# Patient Record
Sex: Male | Born: 1970
Health system: Southern US, Community
[De-identification: ages and names within clinical notes are randomized; demographics above are authoritative.]

## PROBLEM LIST (undated history)

## (undated) DIAGNOSIS — F1910 Other psychoactive substance abuse, uncomplicated: Secondary | ICD-10-CM

## (undated) DIAGNOSIS — F1010 Alcohol abuse, uncomplicated: Secondary | ICD-10-CM

## (undated) DIAGNOSIS — K7460 Unspecified cirrhosis of liver: Secondary | ICD-10-CM

## (undated) DIAGNOSIS — K219 Gastro-esophageal reflux disease without esophagitis: Secondary | ICD-10-CM

## (undated) HISTORY — DX: Other psychoactive substance abuse, uncomplicated: F19.10

## (undated) HISTORY — PX: ROTATOR CUFF REPAIR: SHX139

## (undated) HISTORY — DX: Gastro-esophageal reflux disease without esophagitis: K21.9

---

## 2008-07-03 ENCOUNTER — Ambulatory Visit: Payer: Self-pay | Admitting: Family Medicine

## 2008-07-03 DIAGNOSIS — K029 Dental caries, unspecified: Secondary | ICD-10-CM | POA: Insufficient documentation

## 2008-07-06 ENCOUNTER — Telehealth: Payer: Self-pay | Admitting: Family Medicine

## 2009-11-11 ENCOUNTER — Emergency Department (HOSPITAL_COMMUNITY): Admission: EM | Admit: 2009-11-11 | Discharge: 2009-11-12 | Payer: Self-pay | Admitting: Emergency Medicine

## 2010-06-20 LAB — CBC
MCH: 34.7 pg — ABNORMAL HIGH (ref 26.0–34.0)
MCHC: 34.8 g/dL (ref 30.0–36.0)
RBC: 5.06 MIL/uL (ref 4.22–5.81)
WBC: 12.9 10*3/uL — ABNORMAL HIGH (ref 4.0–10.5)

## 2010-06-20 LAB — DIFFERENTIAL
Basophils Absolute: 0.1 10*3/uL (ref 0.0–0.1)
Lymphocytes Relative: 13 % (ref 12–46)
Monocytes Absolute: 0.9 10*3/uL (ref 0.1–1.0)
Neutrophils Relative %: 79 % — ABNORMAL HIGH (ref 43–77)

## 2010-06-20 LAB — BASIC METABOLIC PANEL
CO2: 22 mEq/L (ref 19–32)
Chloride: 103 mEq/L (ref 96–112)
GFR calc Af Amer: >60 mL/min (ref >60)
GFR calc non Af Amer: >60 mL/min (ref >60)
Potassium: 4.1 mEq/L (ref 3.5–5.1)
Sodium: 139 mEq/L (ref 135–145)

## 2010-06-20 LAB — RAPID URINE DRUG SCREEN, HOSP PERFORMED: Amphetamines: NOT DETECTED

## 2010-06-20 LAB — ETHANOL: Alcohol, Ethyl (B): 5 mg/dL (ref 0–10)

## 2010-06-20 LAB — TRICYCLICS SCREEN, URINE: TCA Scrn: NOT DETECTED

## 2014-12-08 ENCOUNTER — Encounter (HOSPITAL_COMMUNITY): Payer: Self-pay | Admitting: Nurse Practitioner

## 2014-12-08 ENCOUNTER — Inpatient Hospital Stay (HOSPITAL_COMMUNITY)
Admission: EM | Admit: 2014-12-08 | Discharge: 2014-12-12 | DRG: 392 | Disposition: A | Discharge status: Home / Self Care | Payer: Medicaid Other | Attending: Internal Medicine | Admitting: Internal Medicine

## 2014-12-08 DIAGNOSIS — G4730 Sleep apnea, unspecified: Secondary | ICD-10-CM | POA: Diagnosis present

## 2014-12-08 DIAGNOSIS — F17210 Nicotine dependence, cigarettes, uncomplicated: Secondary | ICD-10-CM | POA: Diagnosis present

## 2014-12-08 DIAGNOSIS — K2920 Alcoholic gastritis without bleeding: Secondary | ICD-10-CM | POA: Diagnosis not present

## 2014-12-08 DIAGNOSIS — D696 Thrombocytopenia, unspecified: Secondary | ICD-10-CM

## 2014-12-08 DIAGNOSIS — F10230 Alcohol dependence with withdrawal, uncomplicated: Secondary | ICD-10-CM | POA: Diagnosis not present

## 2014-12-08 DIAGNOSIS — K7460 Unspecified cirrhosis of liver: Secondary | ICD-10-CM | POA: Diagnosis present

## 2014-12-08 DIAGNOSIS — R740 Nonspecific elevation of levels of transaminase and lactic acid dehydrogenase [LDH]: Secondary | ICD-10-CM | POA: Diagnosis present

## 2014-12-08 DIAGNOSIS — Y904 Blood alcohol level of 80-99 mg/100 ml: Secondary | ICD-10-CM | POA: Diagnosis present

## 2014-12-08 DIAGNOSIS — F10239 Alcohol dependence with withdrawal, unspecified: Secondary | ICD-10-CM | POA: Diagnosis present

## 2014-12-08 DIAGNOSIS — R7401 Elevation of levels of liver transaminase levels: Secondary | ICD-10-CM

## 2014-12-08 DIAGNOSIS — I459 Conduction disorder, unspecified: Secondary | ICD-10-CM | POA: Diagnosis present

## 2014-12-08 DIAGNOSIS — D6959 Other secondary thrombocytopenia: Secondary | ICD-10-CM | POA: Diagnosis present

## 2014-12-08 DIAGNOSIS — Z79899 Other long term (current) drug therapy: Secondary | ICD-10-CM

## 2014-12-08 DIAGNOSIS — I455 Other specified heart block: Secondary | ICD-10-CM | POA: Diagnosis not present

## 2014-12-08 DIAGNOSIS — G8929 Other chronic pain: Secondary | ICD-10-CM | POA: Diagnosis present

## 2014-12-08 DIAGNOSIS — I441 Atrioventricular block, second degree: Secondary | ICD-10-CM | POA: Diagnosis not present

## 2014-12-08 DIAGNOSIS — F10232 Alcohol dependence with withdrawal with perceptual disturbance: Secondary | ICD-10-CM | POA: Diagnosis not present

## 2014-12-08 DIAGNOSIS — E876 Hypokalemia: Secondary | ICD-10-CM | POA: Diagnosis present

## 2014-12-08 DIAGNOSIS — R1084 Generalized abdominal pain: Secondary | ICD-10-CM | POA: Diagnosis present

## 2014-12-08 DIAGNOSIS — K769 Liver disease, unspecified: Secondary | ICD-10-CM | POA: Diagnosis present

## 2014-12-08 DIAGNOSIS — F1410 Cocaine abuse, uncomplicated: Secondary | ICD-10-CM | POA: Diagnosis present

## 2014-12-08 DIAGNOSIS — F10939 Alcohol use, unspecified with withdrawal, unspecified: Secondary | ICD-10-CM

## 2014-12-08 HISTORY — DX: Unspecified cirrhosis of liver: K74.60

## 2014-12-08 HISTORY — DX: Alcohol abuse, uncomplicated: F10.10

## 2014-12-08 LAB — CBC
HEMATOCRIT: 47.3 % (ref 39.0–52.0)
HEMOGLOBIN: 16.2 g/dL (ref 13.0–17.0)
MCH: 33.8 pg (ref 26.0–34.0)
MCHC: 34.2 g/dL (ref 30.0–36.0)
MCV: 98.5 fL (ref 78.0–100.0)
Platelets: 107 10*3/uL — ABNORMAL LOW (ref 150–400)
RBC: 4.8 MIL/uL (ref 4.22–5.81)
RDW: 15.2 % (ref 11.5–15.5)
WBC: 8.1 10*3/uL (ref 4.0–10.5)

## 2014-12-08 LAB — COMPREHENSIVE METABOLIC PANEL
ALBUMIN: 3.4 g/dL — AB (ref 3.5–5.0)
ALT: 200 U/L — ABNORMAL HIGH (ref 17–63)
ANION GAP: 9 (ref 5–15)
AST: 408 U/L — AB (ref 15–41)
Alkaline Phosphatase: 239 U/L — ABNORMAL HIGH (ref 38–126)
BILIRUBIN TOTAL: 1.5 mg/dL — AB (ref 0.3–1.2)
CHLORIDE: 100 mmol/L — AB (ref 101–111)
CO2: 25 mmol/L (ref 22–32)
Calcium: 8.5 mg/dL — ABNORMAL LOW (ref 8.9–10.3)
Creatinine, Ser: 0.72 mg/dL (ref 0.61–1.24)
GFR calc Af Amer: >60 mL/min (ref >60)
GLUCOSE: 141 mg/dL — AB (ref 65–99)
POTASSIUM: 3.1 mmol/L — AB (ref 3.5–5.1)
Sodium: 134 mmol/L — ABNORMAL LOW (ref 135–145)
TOTAL PROTEIN: 7.3 g/dL (ref 6.5–8.1)

## 2014-12-08 LAB — RAPID URINE DRUG SCREEN, HOSP PERFORMED
AMPHETAMINES: NOT DETECTED
BARBITURATES: NOT DETECTED
Benzodiazepines: NOT DETECTED
COCAINE: POSITIVE — AB
OPIATES: NOT DETECTED
TETRAHYDROCANNABINOL: NOT DETECTED

## 2014-12-08 LAB — ETHANOL: ALCOHOL ETHYL (B): 89 mg/dL — AB (ref <5)

## 2014-12-08 LAB — POC OCCULT BLOOD, ED: Fecal Occult Bld: NEGATIVE

## 2014-12-08 MED ORDER — SODIUM CHLORIDE 0.9 % IJ SOLN
3.0000 mL | Freq: Two times a day (BID) | INTRAMUSCULAR | Status: DC
  Administered 2014-12-10 – 2014-12-11 (×4): 3 mL via INTRAVENOUS

## 2014-12-08 MED ORDER — FOLIC ACID 1 MG PO TABS
1.0000 mg | ORAL_TABLET | Freq: Every day | ORAL | Status: DC
  Administered 2014-12-09 – 2014-12-12 (×4): 1 mg via ORAL
  Filled 2014-12-08 (×4): qty 1

## 2014-12-08 MED ORDER — SODIUM CHLORIDE 0.9 % IV SOLN: 250.0000 mL | INTRAVENOUS | Status: DC | PRN

## 2014-12-08 MED ORDER — ONDANSETRON HCL 4 MG PO TABS
4.0000 mg | ORAL_TABLET | Freq: Four times a day (QID) | ORAL | Status: DC | PRN
  Administered 2014-12-10 – 2014-12-11 (×2): 4 mg via ORAL
  Filled 2014-12-08 (×4): qty 1

## 2014-12-08 MED ORDER — ONDANSETRON HCL 4 MG/2ML IJ SOLN
4.0000 mg | Freq: Once | INTRAMUSCULAR | Status: AC
  Administered 2014-12-08: 4 mg via INTRAVENOUS
  Filled 2014-12-08: qty 2

## 2014-12-08 MED ORDER — ADULT MULTIVITAMIN W/MINERALS CH
1.0000 | ORAL_TABLET | Freq: Every day | ORAL | Status: DC
  Administered 2014-12-09 – 2014-12-12 (×4): 1 via ORAL
  Filled 2014-12-08 (×4): qty 1

## 2014-12-08 MED ORDER — ACETAMINOPHEN 650 MG RE SUPP: 650.0000 mg | Freq: Four times a day (QID) | RECTAL | Status: DC | PRN

## 2014-12-08 MED ORDER — LORAZEPAM 1 MG PO TABS: 0.0000 mg | ORAL_TABLET | Freq: Two times a day (BID) | ORAL | Status: DC

## 2014-12-08 MED ORDER — LORAZEPAM 2 MG/ML IJ SOLN
0.0000 mg | Freq: Two times a day (BID) | INTRAMUSCULAR | Status: DC
  Filled 2014-12-08: qty 1

## 2014-12-08 MED ORDER — ONDANSETRON HCL 4 MG/2ML IJ SOLN
4.0000 mg | Freq: Four times a day (QID) | INTRAMUSCULAR | Status: DC | PRN
  Administered 2014-12-09 – 2014-12-12 (×9): 4 mg via INTRAVENOUS
  Filled 2014-12-08 (×9): qty 2

## 2014-12-08 MED ORDER — SODIUM CHLORIDE 0.9 % IV SOLN
INTRAVENOUS | Status: AC
  Administered 2014-12-08: 23:00:00 via INTRAVENOUS

## 2014-12-08 MED ORDER — LORAZEPAM 1 MG PO TABS: 1.0000 mg | ORAL_TABLET | Freq: Four times a day (QID) | ORAL | Status: DC | PRN

## 2014-12-08 MED ORDER — LORAZEPAM 1 MG PO TABS: 0.0000 mg | ORAL_TABLET | Freq: Four times a day (QID) | ORAL | Status: DC

## 2014-12-08 MED ORDER — SODIUM CHLORIDE 0.9 % IJ SOLN
3.0000 mL | INTRAMUSCULAR | Status: DC | PRN
  Administered 2014-12-10: 3 mL via INTRAVENOUS
  Filled 2014-12-08: qty 3

## 2014-12-08 MED ORDER — ENOXAPARIN SODIUM 40 MG/0.4ML ~~LOC~~ SOLN
40.0000 mg | SUBCUTANEOUS | Status: DC
  Administered 2014-12-09 – 2014-12-12 (×4): 40 mg via SUBCUTANEOUS
  Filled 2014-12-08 (×4): qty 0.4

## 2014-12-08 MED ORDER — THIAMINE HCL 100 MG/ML IJ SOLN
100.0000 mg | Freq: Every day | INTRAMUSCULAR | Status: DC
  Administered 2014-12-08: 100 mg via INTRAVENOUS
  Filled 2014-12-08: qty 2

## 2014-12-08 MED ORDER — VITAMIN B-1 100 MG PO TABS
100.0000 mg | ORAL_TABLET | Freq: Every day | ORAL | Status: DC
  Administered 2014-12-10 – 2014-12-12 (×3): 100 mg via ORAL
  Filled 2014-12-08 (×4): qty 1

## 2014-12-08 MED ORDER — POTASSIUM CHLORIDE CRYS ER 20 MEQ PO TBCR
40.0000 meq | EXTENDED_RELEASE_TABLET | Freq: Two times a day (BID) | ORAL | Status: DC
  Administered 2014-12-09 – 2014-12-10 (×5): 40 meq via ORAL
  Filled 2014-12-08 (×11): qty 2

## 2014-12-08 MED ORDER — LORAZEPAM 2 MG/ML IJ SOLN
1.0000 mg | Freq: Four times a day (QID) | INTRAMUSCULAR | Status: DC | PRN
  Administered 2014-12-09 (×2): 1 mg via INTRAVENOUS
  Filled 2014-12-08 (×3): qty 1

## 2014-12-08 MED ORDER — PANTOPRAZOLE SODIUM 40 MG PO TBEC
40.0000 mg | DELAYED_RELEASE_TABLET | Freq: Every day | ORAL | Status: DC
  Administered 2014-12-09 – 2014-12-12 (×4): 40 mg via ORAL
  Filled 2014-12-08 (×3): qty 1

## 2014-12-08 MED ORDER — LORAZEPAM 2 MG/ML IJ SOLN: 1.0000 mg | Freq: Once | INTRAMUSCULAR | Status: DC

## 2014-12-08 MED ORDER — SODIUM CHLORIDE 0.9 % IV BOLUS (SEPSIS)
1000.0000 mL | Freq: Once | INTRAVENOUS | Status: AC
  Administered 2014-12-08: 1000 mL via INTRAVENOUS

## 2014-12-08 MED ORDER — LORAZEPAM 1 MG PO TABS
0.0000 mg | ORAL_TABLET | Freq: Four times a day (QID) | ORAL | Status: DC
  Administered 2014-12-09: 4 mg via ORAL
  Filled 2014-12-08: qty 3
  Filled 2014-12-08 (×2): qty 4

## 2014-12-08 MED ORDER — LORAZEPAM 2 MG/ML IJ SOLN
0.0000 mg | Freq: Four times a day (QID) | INTRAMUSCULAR | Status: DC
  Administered 2014-12-08 (×2): 2 mg via INTRAVENOUS

## 2014-12-08 MED ORDER — HYDROCODONE-ACETAMINOPHEN 5-325 MG PO TABS
1.0000 | ORAL_TABLET | ORAL | Status: DC | PRN
  Administered 2014-12-09 – 2014-12-12 (×8): 2 via ORAL
  Filled 2014-12-08 (×10): qty 2

## 2014-12-08 MED ORDER — LORAZEPAM 2 MG/ML IJ SOLN
2.0000 mg | Freq: Once | INTRAMUSCULAR | Status: AC
  Administered 2014-12-08: 2 mg via INTRAVENOUS

## 2014-12-08 MED ORDER — THIAMINE HCL 100 MG/ML IJ SOLN
100.0000 mg | Freq: Every day | INTRAMUSCULAR | Status: DC
  Administered 2014-12-09: 100 mg via INTRAVENOUS
  Filled 2014-12-08: qty 2
  Filled 2014-12-08 (×2): qty 1

## 2014-12-08 MED ORDER — VITAMIN B-1 100 MG PO TABS: 100.0000 mg | ORAL_TABLET | Freq: Every day | ORAL | Status: DC

## 2014-12-08 MED ORDER — LORAZEPAM 2 MG/ML IJ SOLN
1.0000 mg | Freq: Once | INTRAMUSCULAR | Status: AC
  Filled 2014-12-08: qty 1

## 2014-12-08 MED ORDER — ZOLPIDEM TARTRATE 5 MG PO TABS
5.0000 mg | ORAL_TABLET | Freq: Every evening | ORAL | Status: DC | PRN
  Administered 2014-12-10 – 2014-12-11 (×2): 5 mg via ORAL
  Filled 2014-12-08 (×2): qty 1

## 2014-12-08 MED ORDER — SODIUM CHLORIDE 0.9 % IJ SOLN: 3.0000 mL | Freq: Two times a day (BID) | INTRAMUSCULAR | Status: DC

## 2014-12-08 MED ORDER — ACETAMINOPHEN 325 MG PO TABS: 650.0000 mg | ORAL_TABLET | Freq: Four times a day (QID) | ORAL | Status: DC | PRN

## 2014-12-08 NOTE — ED Provider Notes (Signed)
CSN: [id number]     Arrival date & time 12/08/14  1747 History   First MD Initiated Contact with Patient 12/08/14 1813     Chief Complaint  Patient presents with  . Rectal Bleeding  . Alcohol Problem  . Drug Problem     (Consider location/radiation/quality/duration/timing/severity/associated sxs/prior Treatment) The history is provided by the patient.     Pt with hx polysubstance abuse, cirrhosis, presents requesting detox, c/o diffuse abdominal pain, N/V/D x 1 week.  Abdominal pain is described as sharp, dull, and crampy.  Has also had blood on the toilet paper with wiping.  Drinks at least 1/5 liquor daily and takes any drugs he can get including crack, heroin, pills.  Last use was today around noon but notes he only drank enough to control the shakes.  Does admit to depression.  States he has no family and no support system.  Denies SI, HI.  Would like inpatient treatment.    Denies recent abx use.    Past Medical History  Diagnosis Date  . Cirrhosis   . Alcohol abuse    History reviewed. No pertinent past surgical history. History reviewed. No pertinent family history. Social History  Substance Use Topics  . Smoking status: Current Every Day Smoker    Types: Cigarettes  . Smokeless tobacco: None  . Alcohol Use: Yes    Review of Systems  Respiratory: Positive for cough (chronic smoker's cough, unchanged). Negative for shortness of breath.   Cardiovascular: Negative for chest pain.  All other systems reviewed and are negative.     Allergies  Penicillins  Home Medications   Prior to Admission medications   Medication Sig Start Date End Date Taking? Authorizing Provider  esomeprazole (NEXIUM) 40 MG capsule Take 40 mg by mouth daily at 12 noon.   Yes Historical Provider, MD   BP 146/89 mmHg  Pulse 107  Temp(Src) 99.3 F (37.4 C) (Oral)  Resp 22  Ht  (1.803 m)  Wt 239 lb (108.41 kg)  BMI 33.35 kg/m2  SpO2 95% Physical Exam  Constitutional: He appears  well-developed and well-nourished. No distress.  HENT:  Head: Normocephalic and atraumatic.  Neck: Neck supple.  Cardiovascular: Normal rate and regular rhythm.   Pulmonary/Chest: Effort normal and breath sounds normal. No respiratory distress. He has no wheezes. He has no rales.  Abdominal: Soft. He exhibits no distension. There is generalized tenderness. There is no rebound and no guarding.  Genitourinary: Rectal exam shows internal hemorrhoid. Rectal exam shows no external hemorrhoid and no tenderness. Guaiac negative stool.  Neurological: He is alert. He exhibits normal muscle tone.  Skin: He is not diaphoretic.  Psychiatric: His speech is normal and behavior is normal. He exhibits a depressed mood. He expresses no homicidal and no suicidal ideation.  Nursing note and vitals reviewed.   ED Course  Procedures (including critical care time) Labs Review Labs Reviewed  COMPREHENSIVE METABOLIC PANEL - Abnormal; Notable for the following:    Sodium 134 (*)    Potassium 3.1 (*)    Chloride 100 (*)    Glucose, Bld 141 (*)    BUN <5 (*)    Calcium 8.5 (*)    Albumin 3.4 (*)    AST 408 (*)    ALT 200 (*)    Alkaline Phosphatase 239 (*)    Total Bilirubin 1.5 (*)    All other components within normal limits  ETHANOL - Abnormal; Notable for the following:    Alcohol, Ethyl (B) 89 (*)  All other components within normal limits  CBC - Abnormal; Notable for the following:    Platelets 107 (*)    All other components within normal limits  URINE RAPID DRUG SCREEN, HOSP PERFORMED - Abnormal; Notable for the following:    Cocaine POSITIVE (*)    All other components within normal limits  LIPASE, BLOOD  TSH  CBC  COMPREHENSIVE METABOLIC PANEL  POC OCCULT BLOOD, ED    Imaging Review No results found. I have personally reviewed and evaluated these images and lab results as part of my medical decision-making.   EKG Interpretation None      MDM   Final diagnoses:  Alcohol  withdrawal, with unspecified complication    Pt with c/o alcohol and substance abuse, experiencing withdrawal symptoms of shaking and began hallucinating in the ED.  CIWA of 23.  Also with abdominal pain, N/V/D x 1 week.  Diffuse abdominal pain.  Abdomen is nonsurgical.  On review of labs, it appears the lipase never resulted.  Pt has elevation of LFTs, hx cirrhosis, no prior levels available.  Suspect this is chronic.  Admitted to [facility], Dr Mickle Mallory accepting.     Trixie Dredge, PA-C 12/08/14 1610  Lyndal Pulley, MD 12/09/14 [phone]

## 2014-12-08 NOTE — H&P (Signed)
[facility]                                                                                    Patient Demographics  Chad Marks, is a 44 y.o. male  CSN: [id number]  MRN: [id number]  DOB - 25-Sep-1970  Admit Date - 12/08/2014  Outpatient Primary MD for the patient is No primary care provider on file.   With History of -  Past Medical History  Diagnosis Date  . Cirrhosis   . Alcohol abuse       History reviewed. No pertinent past surgical history.  in for   Chief Complaint  Patient presents with  . Rectal Bleeding  . Alcohol Problem  . Drug Problem     HPI  Teng Decou  is a 44 y.o. male, with no significant past medical history but a history of polysubstance drug abuse, presenting today for help. The patient admits to drinking one fifth of liquor a day in addition to heroin and crack. He denies IV abuse. Patient denies any chest pains shortness of breath nausea vomiting or diarrhea, but he started shaking. He says that he cannot go on like this anymore. His last alcohol was at 1:00 this afternoon. He also reports heroin and crack at 1 PM today.    Review of Systems    In addition to the HPI above,  No Fever-chills, No Headache, No changes with Vision or hearing, No problems swallowing food or Liquids, No Chest pain, Cough or Shortness of Breath, No Abdominal pain, No Nausea or Vommitting, Bowel movements are regular, No Blood in stool or Urine, No dysuria, No new skin rashes or bruises, No new joints pains-aches,  No new weakness, tingling, numbness in any extremity, No recent weight gain or loss, No polyuria, polydypsia or polyphagia, No significant Mental Stressors.  A full 10 point Review of Systems was done, except as stated above, all other Review of Systems were negative.   Social History Social History  Substance Use Topics  . Smoking status: Current Every Day Smoker    Types: Cigarettes  . Smokeless tobacco: Not on file  .  Alcohol Use: Yes     Family History History reviewed. No pertinent family history.   Prior to Admission medications   Medication Sig Start Date End Date Taking? Authorizing Provider  esomeprazole (NEXIUM) 20 MG capsule Take 20 mg by mouth 2 (two) times daily as needed (starts with 1 cap daily, takes 1 addt'l as needed).   Yes Historical Provider, MD  omeprazole (PRILOSEC OTC) 20 MG tablet Take 20 mg by mouth daily.   Yes Historical Provider, MD    Allergies  Allergen Reactions  . Penicillins Other (See Comments)    Told not to take med    Physical Exam  Vitals  Blood pressure 128/81, pulse 103, temperature 99.3 F (37.4 C), temperature source Oral, resp. rate 24, height  (1.803 m), weight 108.41 kg (239 lb), SpO2 90 %.   1. General young male very drowsy, well-developed and nourished  2. Normal affect and insight, Not Suicidal or Homicidal, confused.  3. No F.N deficits, grossly, ALL C.Nerves Intact,  4.  Ears and Eyes appear Normal, Conjunctivae clear injected, PERRLA. Moist Oral Mucosa.  5. Supple Neck, No JVD, No cervical lymphadenopathy appriciated, No Carotid Bruits.  6. Symmetrical Chest wall movement, Good air movement bilaterally, CTAB.  7. RRR, No Gallops, Rubs or Murmurs, No Parasternal Heave.  8. Positive Bowel Sounds, Abdomen Soft, Non tender, No organomegaly appriciated,No rebound -guarding or rigidity.  9.  No Cyanosis, Normal Skin Turgor, No Skin Rash or Bruise.  10. Good muscle tone,  joints appear normal , no effusions, Normal ROM.      Data Review  CBC  Recent Labs Lab 12/08/14 1810  WBC 8.1  HGB 16.2  HCT 47.3  PLT 107*  MCV 98.5  MCH 33.8  MCHC 34.2  RDW 15.2   ------------------------------------------------------------------------------------------------------------------  Chemistries   Recent Labs Lab 12/08/14 1810  NA 134*  K 3.1*  CL 100*  CO2 25  GLUCOSE 141*  BUN <5*  CREATININE 0.72  CALCIUM 8.5*  AST  408*  ALT 200*  ALKPHOS 239*  BILITOT 1.5*   ------------------------------------------------------------------------------------------------------------------ estimated creatinine clearance is 147.5 mL/min (by C-G formula based on Cr of 0.72). ------------------------------------------------------------------------------------------------------------------ No results for input(s): TSH, T4TOTAL, T3FREE, THYROIDAB in the last 72 hours.  Invalid input(s): FREET3   Coagulation profile No results for input(s): INR, PROTIME in the last 168 hours. ------------------------------------------------------------------------------------------------------------------- No results for input(s): DDIMER in the last 72 hours. -------------------------------------------------------------------------------------------------------------------  Cardiac Enzymes No results for input(s): CKMB, TROPONINI, MYOGLOBIN in the last 168 hours.  Invalid input(s): CK ------------------------------------------------------------------------------------------------------------------ Invalid input(s): POCBNP   ---------------------------------------------------------------------------------------------------------------  Urinalysis No results found for: COLORURINE, APPEARANCEUR, LABSPEC, PHURINE, GLUCOSEU, HGBUR, BILIRUBINUR, KETONESUR, PROTEINUR, UROBILINOGEN, NITRITE, LEUKOCYTESUR  ----------------------------------------------------------------------------------------------------------------  Imaging results:   No results found.    Assessment & Plan  1. Alcohol withdrawal, SIWA 23 2. Thrombocytopenia probably due to alcohol intake and bone marrow depression 3. Transaminitis 4. Hypokalemia  Plan  Admit to telemetry Withdrawal protocol with Ativan when necessary Follow labs in a.m. Replace potassium   DVT Prophylaxis Lovenox  AM Labs Ordered, also please review Full Orders  Code Status  full  Disposition Plan: Rehabilitation  Time spent in minutes : 32 minutes  Condition GUARDED   @SIGNATURE @

## 2014-12-08 NOTE — ED Notes (Signed)
Attempted report 

## 2014-12-08 NOTE — ED Notes (Signed)
  of ativan given per PA for CIWA score

## 2014-12-08 NOTE — ED Notes (Signed)
He reports heavy alcohol and drug use over the past 2-3 weeks. This week he has noticed some blood in his stool so he is concerned because he reports in the past he had rectal bleeding due to cirrhosis. He would like to detox from drugs and alcohol and have the rectal bleeding checked also. He reports generalized body aches which is normal for him. He reports using "any drug i can get my hands on," no drug use today. He did drink alcohol today. He is diaphoretic and anxious in triage. He denies SI/HUI. He is A&Ox4, breathing easily

## 2014-12-08 NOTE — ED Notes (Signed)
IV attempted x2 without success.

## 2014-12-08 NOTE — ED Notes (Signed)
Pt placed on 2L [place] for sats 88% on room air

## 2014-12-08 NOTE — ED Notes (Signed)
Irving Burton PA notified of CIWA score of 23

## 2014-12-09 ENCOUNTER — Encounter (HOSPITAL_COMMUNITY): Payer: Self-pay | Admitting: *Deleted

## 2014-12-09 DIAGNOSIS — R740 Nonspecific elevation of levels of transaminase and lactic acid dehydrogenase [LDH]: Secondary | ICD-10-CM

## 2014-12-09 DIAGNOSIS — R7401 Elevation of levels of liver transaminase levels: Secondary | ICD-10-CM

## 2014-12-09 DIAGNOSIS — F10232 Alcohol dependence with withdrawal with perceptual disturbance: Secondary | ICD-10-CM

## 2014-12-09 DIAGNOSIS — E876 Hypokalemia: Secondary | ICD-10-CM

## 2014-12-09 DIAGNOSIS — D696 Thrombocytopenia, unspecified: Secondary | ICD-10-CM

## 2014-12-09 DIAGNOSIS — F1410 Cocaine abuse, uncomplicated: Secondary | ICD-10-CM

## 2014-12-09 LAB — COMPREHENSIVE METABOLIC PANEL
ALT: 156 U/L — ABNORMAL HIGH (ref 17–63)
AST: 318 U/L — AB (ref 15–41)
Albumin: 2.6 g/dL — ABNORMAL LOW (ref 3.5–5.0)
Alkaline Phosphatase: 198 U/L — ABNORMAL HIGH (ref 38–126)
Anion gap: 5 (ref 5–15)
CHLORIDE: 103 mmol/L (ref 101–111)
CO2: 27 mmol/L (ref 22–32)
Calcium: 7.6 mg/dL — ABNORMAL LOW (ref 8.9–10.3)
Creatinine, Ser: 0.65 mg/dL (ref 0.61–1.24)
Glucose, Bld: 101 mg/dL — ABNORMAL HIGH (ref 65–99)
POTASSIUM: 3.5 mmol/L (ref 3.5–5.1)
SODIUM: 135 mmol/L (ref 135–145)
Total Bilirubin: 1.2 mg/dL (ref 0.3–1.2)
Total Protein: 6 g/dL — ABNORMAL LOW (ref 6.5–8.1)

## 2014-12-09 LAB — CBC
HCT: 41.2 % (ref 39.0–52.0)
Hemoglobin: 13.3 g/dL (ref 13.0–17.0)
MCH: 32.4 pg (ref 26.0–34.0)
MCHC: 32.3 g/dL (ref 30.0–36.0)
MCV: 100.5 fL — AB (ref 78.0–100.0)
PLATELETS: 77 10*3/uL — AB (ref 150–400)
RBC: 4.1 MIL/uL — ABNORMAL LOW (ref 4.22–5.81)
RDW: 15.2 % (ref 11.5–15.5)
WBC: 4.9 10*3/uL (ref 4.0–10.5)

## 2014-12-09 LAB — TSH: TSH: 1.745 u[IU]/mL (ref 0.350–4.500)

## 2014-12-09 LAB — LIPASE, BLOOD: LIPASE: 17 U/L — AB (ref 22–51)

## 2014-12-09 MED ORDER — LORAZEPAM 2 MG/ML IJ SOLN: 0.0000 mg | Freq: Two times a day (BID) | INTRAMUSCULAR | Status: DC

## 2014-12-09 MED ORDER — LORAZEPAM 2 MG/ML IJ SOLN
3.0000 mg | Freq: Once | INTRAMUSCULAR | Status: AC
  Administered 2014-12-09: 3 mg via INTRAVENOUS
  Filled 2014-12-09: qty 2

## 2014-12-09 MED ORDER — INFLUENZA VAC SPLIT QUAD 0.5 ML IM SUSY
0.5000 mL | PREFILLED_SYRINGE | INTRAMUSCULAR | Status: DC
  Filled 2014-12-09: qty 0.5

## 2014-12-09 MED ORDER — LORAZEPAM 1 MG PO TABS: 1.0000 mg | ORAL_TABLET | Freq: Four times a day (QID) | ORAL | Status: DC | PRN

## 2014-12-09 MED ORDER — NICOTINE 21 MG/24HR TD PT24
21.0000 mg | MEDICATED_PATCH | Freq: Every evening | TRANSDERMAL | Status: DC
  Administered 2014-12-09 – 2014-12-11 (×3): 21 mg via TRANSDERMAL
  Filled 2014-12-09 (×3): qty 1

## 2014-12-09 MED ORDER — PNEUMOCOCCAL VAC POLYVALENT 25 MCG/0.5ML IJ INJ
0.5000 mL | INJECTION | INTRAMUSCULAR | Status: DC
  Filled 2014-12-09: qty 0.5

## 2014-12-09 MED ORDER — LORAZEPAM 2 MG/ML IJ SOLN
0.0000 mg | Freq: Four times a day (QID) | INTRAMUSCULAR | Status: DC
  Administered 2014-12-09: 4 mg via INTRAVENOUS
  Administered 2014-12-10: 2 mg via INTRAVENOUS
  Filled 2014-12-09: qty 2
  Filled 2014-12-09: qty 1

## 2014-12-09 MED ORDER — LORAZEPAM 1 MG PO TABS: 0.0000 mg | ORAL_TABLET | ORAL | Status: DC | PRN

## 2014-12-09 MED ORDER — LORAZEPAM 2 MG/ML IJ SOLN
4.0000 mg | Freq: Once | INTRAMUSCULAR | Status: AC
  Administered 2014-12-09: 4 mg via INTRAVENOUS
  Filled 2014-12-09: qty 2

## 2014-12-09 MED ORDER — LORAZEPAM 1 MG PO TABS
0.0000 mg | ORAL_TABLET | ORAL | Status: DC | PRN
Start: 2014-12-09 — End: 2014-12-09
  Administered 2014-12-09: 2 mg via ORAL
  Administered 2014-12-09 (×2): 4 mg via ORAL
  Filled 2014-12-09: qty 4
  Filled 2014-12-09: qty 2
  Filled 2014-12-09: qty 4

## 2014-12-09 MED ORDER — LORAZEPAM 2 MG/ML IJ SOLN
1.0000 mg | Freq: Four times a day (QID) | INTRAMUSCULAR | Status: DC | PRN
  Administered 2014-12-09 – 2014-12-10 (×2): 1 mg via INTRAVENOUS
  Filled 2014-12-09 (×3): qty 1

## 2014-12-09 NOTE — Progress Notes (Signed)
New Admission Note:   Arrival Method: Via stretcher from the ED - able to ambulate to bed with standby assist Mental Orientation:  A & O x 4 Telemetry: Placed on Tele Box 6E20 Assessment: Completed Skin:  Strong body odor.  He is refusing full skin assessment at this time IV:  Left AC Pain: States he has headache 10/10 and LUQ abdominal pain as 10/10 Tubes:  None Safety Measures: Safety Fall Prevention Plan has been given, discussed and signed Admission: Completed 6 [place] Orientation: Patient has been orientated to the room, unit and staff.  Family:  None  Patient is made aware that he is in camera room and voiced understanding of the reason for this (active alcohol, heroin, and cocaine withdrawal, history of seizures while withdrawing).  Clothes are at bedside.  He did have money, wallet, and keys.  These were itemized and taken to Security to be put in the hospital safe for safe keeping.  He stated that he is currently basically homeless (lives in an abandoned property with a friend) and Social Work consult initiated.  He states he is on Seroquel 400 mg that he has not taken in over a week and note left under Physician Sticky Note for MD to address.    Orders have been reviewed and implemented. Will continue to monitor the patient. Call light has been placed within reach and bed alarm has been activated.   Bernie Covey RN- [place], [place] Phone number: [phone]

## 2014-12-09 NOTE — Progress Notes (Signed)
PROGRESS NOTE  Chad Marks ZOX:[id number] DOB: Aug 24, 1970 DOA: 12/08/2014 PCP: No primary care provider on file.  Brief history 44 year old male with a history of polysubstance abuse including tobacco, alcohol, and cocaine presented to the emergency department with approximately one-week history of vomiting and diarrhea and requesting detox. Patient stated that he drinks at least one fifth of liquor daily and has been using crack cocaine with his last use around noon time 12/08/2014. The patient denies any SI, HI.  Initial labs revealed AST 408, ALT 200, total bilirubin 1.5, lipase 17. Urine drug screen positive for cocaine. In the emergency department, the patient was noted to have withdrawal-like symptoms including hallucinations and jitteriness. Assessment/Plan: Alcohol withdrawal -Continue alcohol withdrawal protocol, increased frequency of benzodiazepine to every hour when necessary agitation -Continue thiamine Hypokalemia -Replete -Check magnesium Nausea/vomiting/abdominal pain/Transaminasemia -Normal lipase -Right upper quadrant ultrasound -Likely due to alcoholic gastritis -Continue PPI -Appears to be improving since hospitalization -continue IVF Thrombocytopenia  -secondary to myelosuppression from alcohol use - patient apparently has underlying cirrhosis  -B12 -HIV -Hep B surface antigen -Hep C antibody Polysubstance abuse  -Including cocaine and tobacco  -NicoDerm patch  -Cessation discussed     Family Communication:   Pt at beside Disposition Plan:   Home 2-3 days      Procedures/Studies:  No results found.      Subjective:  patient is still having significant nausea without emesis. Denies any fevers, chills, chest pain, shortness of breath or diarrhea, hematochezia, melena. There is no hematemesis. He has the progression of abdominal pain.  Objective: Filed Vitals:   12/08/14 2245 12/08/14 2326 12/09/14 0041 12/09/14 0420  BP: 120/78  152/96 117/79 93/57  Pulse: 101 113 94 85  Temp:  98 F (36.7 C) 98.3 F (36.8 C) 97.9 F (36.6 C)  TempSrc:  Oral Oral Axillary  Resp: Height:   (1.803 m)    Weight:  111.131 kg (245 lb)    SpO2: 95% 96% 99% 100%    Intake/Output Summary (Last 24 hours) at 12/09/14 1708 Last data filed at 12/09/14 0818  Gross per 24 hour  Intake 2741.67 ml  Output      0 ml  Net 2741.67 ml   Weight change:  Exam:   General:  Pt is alert, follows commands appropriately, not in acute distress  HEENT: No icterus, No thrush, No neck mass, Tangent/AT  Cardiovascular: RRR, S1/S2, no rubs, no gallops  Respiratory: bibasilar rales. No wheezing.  Abdomen: Soft/+BS, epigastric pain without guarding, non distended, no guarding; no hepatosplenomegaly   Extremities: 1+LE edema, No lymphangitis, No petechiae, No rashes, no synovitis; clubbing of nails without any cyanosis   Data Reviewed: Basic Metabolic Panel:  Recent Labs Lab 12/08/14 1810 12/09/14 0615  NA 134* 135  K 3.1* 3.5  CL 100* 103  CO2 25 27  GLUCOSE 141* 101*  BUN <5* <5*  CREATININE 0.72 0.65  CALCIUM 8.5* 7.6*   Liver Function Tests:  Recent Labs Lab 12/08/14 1810 12/09/14 0615  AST 408* 318*  ALT 200* 156*  ALKPHOS 239* 198*  BILITOT 1.5* 1.2  PROT 7.3 6.0*  ALBUMIN 3.4* 2.6*    Recent Labs Lab 12/08/14 2352  LIPASE 17*   No results for input(s): AMMONIA in the last 168 hours. CBC:  Recent Labs Lab 12/08/14 1810 12/09/14 0615  WBC 8.1 4.9  HGB 16.2 13.3  HCT 47.3 41.2  MCV 98.5 100.5*  PLT 107* 77*   Cardiac Enzymes: No results for input(s): CKTOTAL, CKMB, CKMBINDEX, TROPONINI in the last 168 hours. BNP: Invalid input(s): POCBNP CBG: No results for input(s): GLUCAP in the last 168 hours.  No results found for this or any previous visit (from the past 240 hour(s)).   Scheduled Meds: . enoxaparin (LOVENOX) injection  40 mg Subcutaneous Q24H  . folic acid  1 mg Oral Daily  .  multivitamin with minerals  1 tablet Oral Daily  . pantoprazole  40 mg Oral Daily  . pneumococcal 23 valent vaccine  0.5 mL Intramuscular Tomorrow-1000  . potassium chloride SA  40 mEq Oral BID  . sodium chloride  3 mL Intravenous Q12H  . sodium chloride  3 mL Intravenous Q12H  . thiamine  100 mg Oral Daily   Or  . thiamine  100 mg Intravenous Daily   Continuous Infusions:    Binnie Vonderhaar, DO  [facility] Pager [phone]  If 7PM-7AM, please contact night-coverage [email or web address] Password [email or web address] 12/09/2014, 5:08 PM   LOS: 1 day

## 2014-12-09 NOTE — Progress Notes (Signed)
Patient came to floor at approximately 2315 last night.  He is cooperative.  His  CIWA scores throughout the night were 27, 20, 14, and 22.  [facility] called several times to make aware.  Orders received and initiated each time.  Also, Rapid Response RN was called and made aware of the situation.  At the time of her rounding, the patient was sleeping.  Will continue to monitor patient closely.  [facility] will discuss with oncoming doctor as to whether the scheduled PO ativan can be changed to IV.  [employer] RN-BC, WTA.

## 2014-12-10 ENCOUNTER — Inpatient Hospital Stay (HOSPITAL_COMMUNITY): Payer: Medicaid Other

## 2014-12-10 DIAGNOSIS — F10230 Alcohol dependence with withdrawal, uncomplicated: Secondary | ICD-10-CM

## 2014-12-10 LAB — COMPREHENSIVE METABOLIC PANEL
ALK PHOS: 213 U/L — AB (ref 38–126)
ALT: 174 U/L — ABNORMAL HIGH (ref 17–63)
ANION GAP: 7 (ref 5–15)
AST: 344 U/L — ABNORMAL HIGH (ref 15–41)
Albumin: 2.7 g/dL — ABNORMAL LOW (ref 3.5–5.0)
BUN: <5 mg/dL — ABNORMAL LOW (ref 6–20)
CALCIUM: 8 mg/dL — AB (ref 8.9–10.3)
CO2: 26 mmol/L (ref 22–32)
Chloride: 104 mmol/L (ref 101–111)
Creatinine, Ser: 0.58 mg/dL — ABNORMAL LOW (ref 0.61–1.24)
Glucose, Bld: 85 mg/dL (ref 65–99)
Potassium: 3.9 mmol/L (ref 3.5–5.1)
SODIUM: 137 mmol/L (ref 135–145)
TOTAL PROTEIN: 6.1 g/dL — AB (ref 6.5–8.1)
Total Bilirubin: 1.2 mg/dL (ref 0.3–1.2)

## 2014-12-10 LAB — MRSA PCR SCREENING: MRSA by PCR: NEGATIVE

## 2014-12-10 LAB — CBC
HCT: 40.9 % (ref 39.0–52.0)
HEMOGLOBIN: 13.3 g/dL (ref 13.0–17.0)
MCH: 32.8 pg (ref 26.0–34.0)
MCHC: 32.5 g/dL (ref 30.0–36.0)
MCV: 100.7 fL — ABNORMAL HIGH (ref 78.0–100.0)
Platelets: 64 10*3/uL — ABNORMAL LOW (ref 150–400)
RBC: 4.06 MIL/uL — AB (ref 4.22–5.81)
RDW: 14.9 % (ref 11.5–15.5)
WBC: 4.2 10*3/uL (ref 4.0–10.5)

## 2014-12-10 LAB — HIV ANTIBODY (ROUTINE TESTING W REFLEX): HIV SCREEN 4TH GENERATION: NONREACTIVE

## 2014-12-10 LAB — VITAMIN B12: VITAMIN B 12: 379 pg/mL (ref 180–914)

## 2014-12-10 MED ORDER — SENNA 8.6 MG PO TABS
2.0000 | ORAL_TABLET | Freq: Every day | ORAL | Status: DC
  Administered 2014-12-11: 17.2 mg via ORAL
  Filled 2014-12-10 (×3): qty 2

## 2014-12-10 MED ORDER — LORAZEPAM 2 MG/ML IJ SOLN: 0.0000 mg | INTRAMUSCULAR | Status: DC

## 2014-12-10 MED ORDER — LORAZEPAM 2 MG/ML IJ SOLN
4.0000 mg | Freq: Once | INTRAMUSCULAR | Status: AC
  Administered 2014-12-10: 4 mg via INTRAVENOUS

## 2014-12-10 MED ORDER — MORPHINE SULFATE (PF) 2 MG/ML IV SOLN
2.0000 mg | INTRAVENOUS | Status: DC | PRN
  Administered 2014-12-10 – 2014-12-12 (×10): 2 mg via INTRAVENOUS
  Filled 2014-12-10 (×11): qty 1

## 2014-12-10 MED ORDER — LORAZEPAM 2 MG/ML IJ SOLN
0.0000 mg | INTRAMUSCULAR | Status: DC | PRN
  Administered 2014-12-10: 2 mg via INTRAVENOUS
  Administered 2014-12-10 (×2): 4 mg via INTRAVENOUS
  Administered 2014-12-10: 2 mg via INTRAVENOUS
  Administered 2014-12-11: 1 mg via INTRAVENOUS
  Administered 2014-12-11 (×3): 2 mg via INTRAVENOUS
  Administered 2014-12-11: 1 mg via INTRAVENOUS
  Administered 2014-12-12 (×2): 2 mg via INTRAVENOUS
  Filled 2014-12-10: qty 2
  Filled 2014-12-10 (×9): qty 1
  Filled 2014-12-10 (×2): qty 2
  Filled 2014-12-10 (×2): qty 1

## 2014-12-10 MED ORDER — LORAZEPAM 2 MG/ML IJ SOLN
0.0000 mg | INTRAMUSCULAR | Status: DC
  Administered 2014-12-10 (×3): 4 mg via INTRAVENOUS
  Filled 2014-12-10 (×3): qty 2

## 2014-12-10 MED ORDER — LORAZEPAM 2 MG/ML IJ SOLN
0.0000 mg | INTRAMUSCULAR | Status: DC
  Administered 2014-12-12: 2 mg via INTRAVENOUS

## 2014-12-10 MED ORDER — DOCUSATE SODIUM 100 MG PO CAPS
100.0000 mg | ORAL_CAPSULE | Freq: Two times a day (BID) | ORAL | Status: DC
  Administered 2014-12-11 (×2): 100 mg via ORAL
  Filled 2014-12-10 (×3): qty 1

## 2014-12-10 NOTE — Significant Event (Signed)
Rapid Response Event Note  Overview: Time Called: 2315 Arrival Time: 2320 Event Type: Neurologic  Initial Focused Assessment:  Called by Cala Bradford, RN  With pt's CIWA now 34, pt wanting to sign out AMA, experiencing increased agitation, visual hallucinations, severe diaphoresis. Received ativan 4 mg at 2001,  And 1 mg at 2343 and 0039 hrs.   On arrival to room pt in bed with hospital gown wrapped around his legs and trying to remove cardiac monitor.  Oriented to self and place, confused to time.  Speech clear, states 10/10 HA, wants  to leave to go home and get some sleep. C/o nausea, denies vomiting.   Visible tremors noted to BUE, mild diaphoresis, nauseated, tactile disturbances of bugs crawling on entire body, visual disturbances of flashing strobe lights , and seeing people opening and closing his room door when no one is there.  Agitated and restless but not belligerant or combative. VS: 156/105  HR 95 SR,  RR 18 with O2 sats 99% on 2l [place] Bil[place] BS coarse, abd obese, active bowel sounds.    Bed alarm in place.  Continue to support pt and staff.  Awaiting CCM eval   Event Summary:t 2310 Tama Gander, NP paged at [phone] and 2355.  Dr Toniann Fail at bedside at 0100.  CCM consulted  at  0105. Awaiting CCM eval for placement.   69 Spoke with Dr Belinda Block, CCM who feels pt does not qualify for ICU placement at this time despite elevated CIWA score. Will transfer to SDU per Dr Toniann Fail. for closer observation.  0300  Pt trasferred to 3S05.  Report given by Cala Bradford, RN 6 E to 3S staff.  Will follow as needed

## 2014-12-10 NOTE — Progress Notes (Signed)
During the night, the patient's CIWA score continued to trend up, reaching as high as 34.  [facility] floor coverage provider consulted with, orders received, and implemented.  Rapid Response called and evaluated patient.  Dr. Toniann Fail and Dr. Belinda Block also assessed patient.  Step down transfer was recommended and patient was transferred to 3S05 successfully.  He is voicing desire to sign himself out of hospital.  Patient encouraged to stay and received treatment.  [employer] RN-BC, WTA.

## 2014-12-10 NOTE — Progress Notes (Addendum)
Pt did have an earlier pause of 3.95 which looked like artifact on screen and on strip.  Pt asymptomatic at that time. RN replaced the leads and continued to monitor. Pt had a 4.3 second pause, questionable artifact.  Again, pt is asymptomatic. Notified MD. EKG ordered.  Will continue to monitor.  Dr Tat at bedside to review strips and EKG.

## 2014-12-10 NOTE — Progress Notes (Signed)
Utilization Review Completed.[name] T9/08/2014  

## 2014-12-10 NOTE — Progress Notes (Signed)
PROGRESS NOTE  Chad Marks ZOX:[id number] DOB: [birth year] DOA: 12/08/2014 PCP: No primary care provider on file.  Brief history 44 year old male with a history of polysubstance abuse including tobacco, alcohol, and cocaine presented to the emergency department with approximately one-week history of vomiting and diarrhea and requesting detox. Patient stated that he drinks at least one fifth of liquor daily and has been using crack cocaine with his last use around noon time 12/08/2014. The patient denies any SI, HI. Initial labs revealed AST 408, ALT 200, total bilirubin 1.5, lipase 17. Urine drug screen positive for cocaine. In the emergency department, the patient was noted to have withdrawal-like symptoms including hallucinations and jitteriness. Assessment/Plan: Alcohol withdrawal -Continue alcohol withdrawal protocol, increased frequency of benzodiazepine to every 2 hours  when necessary agitation -Continue thiamine Hypokalemia -Replete -Check magnesium Nausea/vomiting/abdominal pain/Transaminasemia -Normal lipase -Right upper quadrant ultrasound--mild GB wall thicken without pericholecystic fluid, sludge or stones -Likely due to alcoholic gastritis -Continue PPI -Improved since hospitalization -continue IVF Thrombocytopenia  -secondary to myelosuppression from alcohol use - patient apparently has underlying cirrhosis  -B12--379 -HIV--pending -Hep B surface antigen--pending -Hep C antibody--pending Polysubstance abuse  -Including cocaine and tobacco  -NicoDerm patch  -Cessation discussed     Procedures/Studies: US Abdomen Limited Ruq  2015-01-05   CLINICAL DATA:  44 year old male with abdominal pain. Medical history indicates Cirrhosis. Initial encounter.  EXAM: US ABDOMEN LIMITED - RIGHT UPPER QUADRANT  COMPARISON:  [facility] CT Abdomen and Pelvis 07/27/2008  FINDINGS: Gallbladder:  Mild wall thickening up to 5 mm. No sludge or echogenic  stones. No sonographic Murphy sign elicited. No pericholecystic fluid.  Common bile duct:  Diameter: Suboptimally visualized.  4-5 mm, normal.  Liver:  Echogenic liver. No intrahepatic biliary ductal dilatation. Right lobe round and mildly lobulated echogenic area measuring 3.6 x 3.4 x 3.7 cm (image 32). There does appear to be some internal vascularity on power Doppler (image 35. This lesion was not evident on the noncontrast 2010 study. No other discrete liver lesion.  Other findings: Negative visible right kidney  IMPRESSION: 1. Hepatic steatosis. Nonspecific 3.7 cm discrete liver lesion in the right lobe. CT Abdomen and Pelvis with IV contrast may characterize further. Failing that, an outpatient liver MRI without and with contrast would be recommended. 2. Diffuse gallbladder wall thickening, favor secondary to chronic liver disease in this setting. No gallstones or biliary obstruction.   Electronically Signed   By: Odessa Fleming M.D.   On: January 05, 2015 08:42         Subjective: Patient wanted to leave AGAINST MEDICAL ADVICE this morning again. After long discussion, patient did stay. Denies any fevers, chills, headache, chest pain, shortness breath, nausea, vomiting, diarrhea, abdominal pain. No dysuria or hematuria. No rashes.  Objective: Filed Vitals:   January 05, 2015 0249 01-05-15 0250 2015/01/05 0429 01-05-2015 0826  BP: 168/108   149/91  Pulse:      Temp:  98.5 F (36.9 C)    TempSrc:  Oral Oral Oral  Resp:      Height:      Weight:      SpO2:        Intake/Output Summary (Last 24 hours) at 01/05/15 1202 Last data filed at 05-Jan-2015 0800  Gross per 24 hour  Intake    800 ml  Output   1125 ml  Net   -325 ml   Weight change:  Exam:   General:  Pt is alert, follows  commands appropriately, not in acute distress  HEENT: No icterus, No thrush, No neck mass, /AT  Cardiovascular: RRR, S1/S2, no rubs, no gallops  Respiratory: Bibasilar rales. Good air movement. No wheezing.  Abdomen:  Soft/+BS, non tender, non distended, no guarding  Extremities: 1+LE  edema, No lymphangitis, No petechiae, No rashes, no synovitis  Data Reviewed: Basic Metabolic Panel:  Recent Labs Lab 12/08/14 1810 12/09/14 0615 12/10/14 0600  NA 134* 135 137  K 3.1* 3.5 3.9  CL 100* 103 104  CO2 GLUCOSE 141* 101* 85  BUN <5* <5* <5*  CREATININE 0.72 0.65 0.58*  CALCIUM 8.5* 7.6* 8.0*   Liver Function Tests:  Recent Labs Lab 12/08/14 1810 12/09/14 0615 12/10/14 0600  AST 408* 318* 344*  ALT 200* 156* 174*  ALKPHOS 239* 198* 213*  BILITOT 1.5* 1.2 1.2  PROT 7.3 6.0* 6.1*  ALBUMIN 3.4* 2.6* 2.7*    Recent Labs Lab 12/08/14 2352  LIPASE 17*   No results for input(s): AMMONIA in the last 168 hours. CBC:  Recent Labs Lab 12/08/14 1810 12/09/14 0615 12/10/14 0600  WBC 8.1 4.9 4.2  HGB 16.2 13.3 13.3  HCT 47.3 41.2 40.9  MCV 98.5 100.5* 100.7*  PLT 107* 77* 64*   Cardiac Enzymes: No results for input(s): CKTOTAL, CKMB, CKMBINDEX, TROPONINI in the last 168 hours. BNP: Invalid input(s): POCBNP CBG: No results for input(s): GLUCAP in the last 168 hours.  Recent Results (from the past 240 hour(s))  MRSA PCR Screening     Status: None   Collection Time: 12/10/14  5:46 AM  Result Value Ref Range Status   MRSA by PCR NEGATIVE NEGATIVE Final    Comment:        The GeneXpert MRSA Assay (FDA approved for NASAL specimens only), is one component of a comprehensive MRSA colonization surveillance program. It is not intended to diagnose MRSA infection nor to guide or monitor treatment for MRSA infections.      Scheduled Meds: . enoxaparin (LOVENOX) injection  40 mg Subcutaneous Q24H  . folic acid  1 mg Oral Daily  . Influenza vac split quadrivalent PF  0.5 mL Intramuscular Tomorrow-1000  . [START ON 12/13/2014] LORazepam  0-4 mg Intravenous Q4H   Followed by  . LORazepam  0-4 mg Intravenous Q2H  . multivitamin with minerals  1 tablet Oral Daily  . nicotine   21 mg Transdermal QPM  . pantoprazole  40 mg Oral Daily  . pneumococcal 23 valent vaccine  0.5 mL Intramuscular Tomorrow-1000  . potassium chloride SA  40 mEq Oral BID  . sodium chloride  3 mL Intravenous Q12H  . sodium chloride  3 mL Intravenous Q12H  . thiamine  100 mg Oral Daily   Or  . thiamine  100 mg Intravenous Daily   Continuous Infusions:    Karolyna Bianchini, DO  [facility] Pager [phone]  If 7PM-7AM, please contact night-coverage [email or web address] Password [email or web address] 12/10/2014, 12:02 PM   LOS: 2 days

## 2014-12-11 DIAGNOSIS — I441 Atrioventricular block, second degree: Secondary | ICD-10-CM

## 2014-12-11 LAB — COMPREHENSIVE METABOLIC PANEL
ALBUMIN: 2.8 g/dL — AB (ref 3.5–5.0)
ALK PHOS: 211 U/L — AB (ref 38–126)
ALT: 173 U/L — AB (ref 17–63)
ANION GAP: 7 (ref 5–15)
AST: 279 U/L — ABNORMAL HIGH (ref 15–41)
BILIRUBIN TOTAL: 1.3 mg/dL — AB (ref 0.3–1.2)
CALCIUM: 8.3 mg/dL — AB (ref 8.9–10.3)
CO2: 28 mmol/L (ref 22–32)
CREATININE: 0.66 mg/dL (ref 0.61–1.24)
Chloride: 103 mmol/L (ref 101–111)
GFR calc Af Amer: >60 mL/min (ref >60)
GFR calc non Af Amer: >60 mL/min (ref >60)
GLUCOSE: 94 mg/dL (ref 65–99)
Potassium: 4.5 mmol/L (ref 3.5–5.1)
SODIUM: 138 mmol/L (ref 135–145)
TOTAL PROTEIN: 6.6 g/dL (ref 6.5–8.1)

## 2014-12-11 LAB — MAGNESIUM: Magnesium: 1.9 mg/dL (ref 1.7–2.4)

## 2014-12-11 LAB — HEPATITIS C ANTIBODY

## 2014-12-11 LAB — HEPATITIS B SURFACE ANTIGEN: HEP B S AG: NEGATIVE

## 2014-12-11 NOTE — Progress Notes (Addendum)
PROGRESS NOTE  Chad Marks VHQ:[id number] DOB: [birth year] DOA: 12/08/2014 PCP: No primary care provider on file.  Brief history 44 year old male with a history of polysubstance abuse including tobacco, alcohol, and cocaine presented to the emergency department with approximately one-week history of vomiting and diarrhea and requesting detox. Patient stated that he drinks at least one fifth of liquor daily and has been using crack cocaine with his last use around noon time 12/08/2014. The patient denies any SI, HI. Initial labs revealed AST 408, ALT 200, total bilirubin 1.5, lipase 17. Urine drug screen positive for cocaine. In the emergency department, the patient was noted to have withdrawal-like symptoms including hallucinations and jitteriness. Assessment/Plan: Alcohol withdrawal -Continue alcohol withdrawal protocol, increased frequency of benzodiazepine to every 2 hours when necessary agitation -Continue thiamine Questionable Sinus Pauses vs AV Block?? -appear to be artifact on telemetry -lasts 2-3 seconds -RNs requesting cardiology eval due to frequency of occurrence -consult cardiology -pt completely asymptomatic -K=4.5, Mg 1.9 Hypokalemia -Repleted -Check magnesium--1.9 Nausea/vomiting/abdominal pain/Transaminasemia -Normal lipase -Right upper quadrant ultrasound--mild GB wall thicken without pericholecystic fluid, sludge or stones -Likely due to alcoholic gastritis -Continue PPI -LFTs trending down -continue IVF -presently tolerating diet -2 view abdomen xray Thrombocytopenia  -secondary to myelosuppression from alcohol use - patient apparently has underlying cirrhosis  -B12--379 -HIV--neg -Hep B surface antigen--pending -Hep C antibody--pending Polysubstance abuse  -Including cocaine and tobacco  -NicoDerm patch  -Cessation discussed    Family Communication:   Pt at beside Disposition Plan:   Home 1-2 days      Procedures/Studies: US  Abdomen Limited Ruq  12/10/2014   CLINICAL DATA:  44 year old male with abdominal pain. Medical history indicates Cirrhosis. Initial encounter.  EXAM: US ABDOMEN LIMITED - RIGHT UPPER QUADRANT  COMPARISON:  [facility] CT Abdomen and Pelvis 07/27/2008  FINDINGS: Gallbladder:  Mild wall thickening up to 5 mm. No sludge or echogenic stones. No sonographic Murphy sign elicited. No pericholecystic fluid.  Common bile duct:  Diameter: Suboptimally visualized.  4-5 mm, normal.  Liver:  Echogenic liver. No intrahepatic biliary ductal dilatation. Right lobe round and mildly lobulated echogenic area measuring 3.6 x 3.4 x 3.7 cm (image 32). There does appear to be some internal vascularity on power Doppler (image 35. This lesion was not evident on the noncontrast 2010 study. No other discrete liver lesion.  Other findings: Negative visible right kidney  IMPRESSION: 1. Hepatic steatosis. Nonspecific 3.7 cm discrete liver lesion in the right lobe. CT Abdomen and Pelvis with IV contrast may characterize further. Failing that, an outpatient liver MRI without and with contrast would be recommended. 2. Diffuse gallbladder wall thickening, favor secondary to chronic liver disease in this setting. No gallstones or biliary obstruction.   Electronically Signed   By: Odessa Fleming M.D.   On: 12/10/2014 08:42         Subjective: Patient complains of intermittent upper abdominal pain. Denies any vomiting, diarrhea, chest discomfort, shortness breath, fevers, chills. There is no hematochezia or melena. No dysuria or hematuria. Denies any headaches.  Objective: Filed Vitals:   12/10/14 2330 12/11/14 0125 12/11/14 0315 12/11/14 0803  BP: 160/91  137/89 152/95  Pulse: 78  83 85  Temp: 97.3 F (36.3 C)  97.7 F (36.5 C) 98.3 F (36.8 C)  TempSrc: Oral  Oral Oral  Resp: Height:      Weight:      SpO2: 96% 73% 96%  95%    Intake/Output Summary (Last 24 hours) at 12/11/14 0844 Last data filed at  12/11/14 0804  Gross per 24 hour  Intake    960 ml  Output   1425 ml  Net   -465 ml   Weight change:  Exam:   General:  Pt is alert, follows commands appropriately, not in acute distress  HEENT: No icterus, No thrush, No neck mass, [place]/AT  Cardiovascular: RRR, S1/S2, no rubs, no gallops  Respiratory: Bibasilar rales. No wheeze. Good air movement  Abdomen: Soft/+BS, epigastric and RUQ tender without any rebound, mildly distended, no guarding  Extremities: No edema, No lymphangitis, No petechiae, No rashes, no synovitis  Data Reviewed: Basic Metabolic Panel:  Recent Labs Lab 12/08/14 1810 12/09/14 0615 12/10/14 0600 12/11/14 0312  NA 134* 135 137 138  K 3.1* 3.5 3.9 4.5  CL 100* 103 104 103  CO2 25 27 26 28   GLUCOSE 141* 101* 85 94  BUN <5* <5* <5* <5*  CREATININE 0.72 0.65 0.58* 0.66  CALCIUM 8.5* 7.6* 8.0* 8.3*  MG  --   --   --  1.9   Liver Function Tests:  Recent Labs Lab 12/08/14 1810 12/09/14 0615 12/10/14 0600 12/11/14 0312  AST 408* 318* 344* 279*  ALT 200* 156* 174* 173*  ALKPHOS 239* 198* 213* 211*  BILITOT 1.5* 1.2 1.2 1.3*  PROT 7.3 6.0* 6.1* 6.6  ALBUMIN 3.4* 2.6* 2.7* 2.8*    Recent Labs Lab 12/08/14 2352  LIPASE 17*   No results for input(s): AMMONIA in the last 168 hours. CBC:  Recent Labs Lab 12/08/14 1810 12/09/14 0615 12/10/14 0600  WBC 8.1 4.9 4.2  HGB 16.2 13.3 13.3  HCT 47.3 41.2 40.9  MCV 98.5 100.5* 100.7*  PLT 107* 77* 64*   Cardiac Enzymes: No results for input(s): CKTOTAL, CKMB, CKMBINDEX, TROPONINI in the last 168 hours. BNP: Invalid input(s): POCBNP CBG: No results for input(s): GLUCAP in the last 168 hours.  Recent Results (from the past 240 hour(s))  MRSA PCR Screening     Status: None   Collection Time: 12/10/14  5:46 AM  Result Value Ref Range Status   MRSA by PCR NEGATIVE NEGATIVE Final    Comment:        The GeneXpert MRSA Assay (FDA approved for NASAL specimens only), is one component of  a comprehensive MRSA colonization surveillance program. It is not intended to diagnose MRSA infection nor to guide or monitor treatment for MRSA infections.      Scheduled Meds: . docusate sodium  100 mg Oral BID  . enoxaparin (LOVENOX) injection  40 mg Subcutaneous Q24H  . folic acid  1 mg Oral Daily  . Influenza vac split quadrivalent PF  0.5 mL Intramuscular Tomorrow-1000  . [START ON 12/13/2014] LORazepam  0-4 mg Intravenous Q4H  . multivitamin with minerals  1 tablet Oral Daily  . nicotine  21 mg Transdermal QPM  . pantoprazole  40 mg Oral Daily  . pneumococcal 23 valent vaccine  0.5 mL Intramuscular Tomorrow-1000  . senna  2 tablet Oral Daily  . sodium chloride  3 mL Intravenous Q12H  . sodium chloride  3 mL Intravenous Q12H  . thiamine  100 mg Oral Daily   Or  . thiamine  100 mg Intravenous Daily   Continuous Infusions:    Kenya Shiraishi, DO  [facility] Pager [phone]  If 7PM-7AM, please contact night-coverage [email or web address] Password [email or web address] 12/11/2014, 8:44 AM   LOS: 3 days

## 2014-12-11 NOTE — Consult Note (Signed)
ELECTROPHYSIOLOGY CONSULT NOTE    Patient ID: Chad Marks MRN: [id number], DOB/AGE: 1970/10/27 44 y.o.  Admit date: 12/08/2014 Date of Consult: 12/11/2014  Primary Physician: No primary care provider on file. Primary Cardiologist: new to [facility]  Reason for Consultation: heart block  HPI:  Chad Marks is a 44 y.o. male with a past medical history significant for cirrhosis, alcohol and cocaine abuse. He presented to the ER on the day of admission with a one week history of diarrhea and vomiting and requested detox.  LFT's were elevated and he was admitted for further evaluation. He has been drinking a fifth of liquor daily and last used cocaine on 12/08/14.  On telemetry, he has been noted to have episodes of heart block with pauses of up to 4 seconds.  EP has been asked to evaluate for treatment options.    He denies chest pain, shortness of breath, palpitations, dizziness, pre-syncope or syncope.  He currently states that he is having abdominal pain that worsens after he eats.  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  He has not ambulated this admission.   He has had no prior cardiac evaluation.   Past Medical History  Diagnosis Date  . Cirrhosis   . Alcohol abuse      Surgical History: History reviewed. No pertinent past surgical history.   Prescriptions prior to admission  Medication Sig Dispense Refill Last Dose  . esomeprazole (NEXIUM) 20 MG capsule Take 20 mg by mouth 2 (two) times daily as needed (starts with 1 cap daily, takes 1 addt'l as needed).   12/08/2014 at Unknown time  . omeprazole (PRILOSEC OTC) 20 MG tablet Take 20 mg by mouth daily.   12/08/2014 at Unknown time    Inpatient Medications:  . docusate sodium  100 mg Oral BID  . enoxaparin (LOVENOX) injection  40 mg Subcutaneous Q24H  . folic acid  1 mg Oral Daily  . Influenza vac split quadrivalent PF  0.5 mL Intramuscular Tomorrow-1000  . [START ON 12/13/2014]  LORazepam  0-4 mg Intravenous Q4H  . multivitamin with minerals  1 tablet Oral Daily  . nicotine  21 mg Transdermal QPM  . pantoprazole  40 mg Oral Daily  . pneumococcal 23 valent vaccine  0.5 mL Intramuscular Tomorrow-1000  . senna  2 tablet Oral Daily  . sodium chloride  3 mL Intravenous Q12H  . sodium chloride  3 mL Intravenous Q12H  . thiamine  100 mg Oral Daily    Allergies:  Allergies  Allergen Reactions  . Penicillins Other (See Comments)    Told not to take med    Social History   Social History  . Marital Status: Single    Spouse Name: N/A  . Number of Children: N/A  . Years of Education: N/A   Occupational History  . Not on file.   Social History Main Topics  . Smoking status: Current Every Day Smoker    Types: Cigarettes  . Smokeless tobacco: Not on file  . Alcohol Use: Yes  . Drug Use: Yes    Special: IV, Cocaine, Marijuana, Methamphetamines  . Sexual Activity: Not on file   Other Topics Concern  . Not on file   Social History Narrative     Family History - no early CAD  Review of Systems: General: No chills, fever, night sweats    Cardiovascular:  No chest pain, dyspnea on exertion, edema, orthopnea, palpitations, paroxysmal nocturnal dyspnea  Dermatological: No rash, lesions or masses Respiratory: No cough, dyspnea Urologic: No hematuria, dysuria Abdominal: No bright red blood per rectum, melena, or hematemesis Neurologic: No visual changes, weakness, changes in mental status All other systems reviewed and are otherwise negative except as noted above.  Physical Exam: Filed Vitals:   12/10/14 2330 12/11/14 0125 12/11/14 0315 12/11/14 0803  BP: 160/91  137/89 152/95  Pulse: 78  83 85  Temp: 97.3 F (36.3 C)  97.7 F (36.5 C) 98.3 F (36.8 C)  TempSrc: Oral  Oral Oral  Resp: Height:      Weight:      SpO2: 96% 73% 96% 95%    GEN- The patient is disheveled appearing, alert and oriented x 3 today.   HEENT: normocephalic,  atraumatic; sclera clear, conjunctiva pink; hearing intact; oropharynx clear; neck supple Lungs- Clear to ausculation bilaterally, normal work of breathing.  No wheezes, rales, rhonchi Heart- Regular rate and rhythm  GI- +tenderness, soft, non-distended, bowel sounds present Extremities- no clubbing, cyanosis, or edema MS- no significant deformity or atrophy Skin- warm and dry, no rash or lesion Psych- euthymic mood, full affect Neuro- strength and sensation are intact  Labs:   Lab Results  Component Value Date   WBC 4.2 12/10/2014   HGB 13.3 12/10/2014   HCT 40.9 12/10/2014   MCV 100.7* 12/10/2014   PLT 64* 12/10/2014    Recent Labs Lab 12/11/14 0312  NA 138  K 4.5  CL 103  CO2 28  BUN <5*  CREATININE 0.66  CALCIUM 8.3*  PROT 6.6  BILITOT 1.3*  ALKPHOS 211*  ALT 173*  AST 279*  GLUCOSE 94     Radiology/Studies: US Abdomen Limited Ruq 12/10/2014   CLINICAL DATA:  44 year old male with abdominal pain. Medical history indicates Cirrhosis. Initial encounter.  EXAM: US ABDOMEN LIMITED - RIGHT UPPER QUADRANT  COMPARISON:  [facility] CT Abdomen and Pelvis 07/27/2008  FINDINGS: Gallbladder:  Mild wall thickening up to 5 mm. No sludge or echogenic stones. No sonographic Murphy sign elicited. No pericholecystic fluid.  Common bile duct:  Diameter: Suboptimally visualized.  4-5 mm, normal.  Liver:  Echogenic liver. No intrahepatic biliary ductal dilatation. Right lobe round and mildly lobulated echogenic area measuring 3.6 x 3.4 x 3.7 cm (image 32). There does appear to be some internal vascularity on power Doppler (image 35. This lesion was not evident on the noncontrast 2010 study. No other discrete liver lesion.  Other findings: Negative visible right kidney  IMPRESSION: 1. Hepatic steatosis. Nonspecific 3.7 cm discrete liver lesion in the right lobe. CT Abdomen and Pelvis with IV contrast may characterize further. Failing that, an outpatient liver MRI without and with  contrast would be recommended. 2. Diffuse gallbladder wall thickening, favor secondary to chronic liver disease in this setting. No gallstones or biliary obstruction.   Electronically Signed   By: Odessa Fleming M.D.   On: 12/10/2014 08:42    EKG: sinus tach, rate 103, LVH, diffuse ST depression  TELEMETRY: sinus rhythm with intermittent heart block and pauses of up to 4 seconds (most of the time while sleeping)  Assessment/Plan: 1.  Intermittent asymptomatic heart block The patient has intermittent asymptomatic high grade heart block.  He has not had prolonged pauses.  He also is completely asymptomatic.  Most episodes occur while sleeping with slight P-P prolongation and are possibly related to sleep apnea He is not currently a candidate for EP procedures with ongoing substance abuse  issues.  His pauses have been nocturnal and likely related to sleep apnea with no acute indication for pacing.  Would avoid AVN blocking agents and check echo Recommend outpatient sleep study  2.  Alcohol/cocaine abuse Cessation advised Management per primary team  Dr Elberta Fortis to see later today No further inpatient EP work up planned Please call with questions  Signed, Gypsy Balsam, NP 12/11/2014 11:09 AM  I have seen and examined this patient with Gypsy Balsam.  Agree with above, note added to reflect my findings.  On exam, regular rhythm, no murmurs or wheezing.  Patient has pauses while sleeping.  He states that he has been asymptomatic through this without any complaints of dizziness, weakness, or palpitations.  He may require pacing in the future, but his substance abuse issues including recent high doses of alcohol, along with crack, and heroine make him a poor candidate for a procedure.  Will continue to monitor for further episodes and will schedule follow up in clinic.  Will also get a TTE to assess for other causes of pauses and structural heart disease.  Will M. Camnitz MD 12/11/2014 3:42 PM

## 2014-12-11 NOTE — Progress Notes (Signed)
Chad Delay, NP notified that patient having intermittent ventricular standstill. Patient asymptomatic. New orders received. Will continue to monitor.

## 2014-12-11 NOTE — Clinical Social Work Note (Signed)
Clinical Social Work Assessment  Patient Details  Name: Chad Marks MRN: [id number] Date of Birth: 19-Nov-1970  Date of referral:  12/09/14               Reason for consult:  Substance Use/ETOH Abuse                Housing/Transportation Living arrangements for the past 2 months:  Single Family Home (Rent a room ) Source of Information:  Patient Patient Interpreter Needed:  None Criminal Activity/Legal Involvement Pertinent to Current Situation/Hospitalization:  No - Comment as needed Significant Relationships:  Friend Lives with:  Roommate Do you feel safe going back to the place where you live?  Yes Need for family participation in patient care:  No (Coment)  Care giving concerns:  N/A  Facilities manager / plan:  CSW met the pt at bedside. CSW introduced self and purpose of the visit. CSW and pt discussed current substance/ETOH use. Pt denied use any use of drugs. Pt acknowledged that he drinks every day. Pt identified that he would like to participate in an outpatient program. CSW and pt reviewed the outpatient programs. Pt reported that he will contact the ADS agency. SBIRT complete.   Employment status:  Disabled (Comment on whether or not currently receiving Disability) Insurance information:  Medicaid In [place] PT Recommendations:  Not assessed at this time Information / Referral to community resources:  Outpatient Substance Abuse Treatment Options, Residential Substance Abuse Treatment Options  Patient/Family's Response to care:  Pt reported that the care in which he received has been great.   Patient/Family's Understanding of and Emotional Response to Diagnosis, Current Treatment, and Prognosis:  Pt acknowledged his current diagnosis and current treatment. Pt was receptive to the resources provided. Pt shared that he would like to stop drinking.   Emotional Assessment Appearance:  Appears stated age Attitude/Demeanor/Rapport:   (Cooperative ) Affect (typically  observed):    Orientation:  Oriented to Self, Oriented to Place, Oriented to  Time, Oriented to Situation Alcohol / Substance use:  Not Applicable Psych involvement (Current and /or in the community):  No (Comment)  Discharge Needs  Concerns to be addressed:  Denies Needs/Concerns at this time Readmission within the last 30 days:  No Current discharge risk:  None Barriers to Discharge:  No Barriers Identified   Chad Bertoni, LCSW 12/11/2014, 1:28 PM

## 2014-12-11 NOTE — Progress Notes (Addendum)
Pt to TX to 6E-19, VSS, called report. Pt declined a courtesy call to contact person listed to notify of TX.

## 2014-12-12 ENCOUNTER — Telehealth: Payer: Self-pay

## 2014-12-12 ENCOUNTER — Inpatient Hospital Stay (HOSPITAL_BASED_OUTPATIENT_CLINIC_OR_DEPARTMENT_OTHER): Payer: Medicaid Other

## 2014-12-12 DIAGNOSIS — I455 Other specified heart block: Secondary | ICD-10-CM | POA: Diagnosis not present

## 2014-12-12 DIAGNOSIS — F10239 Alcohol dependence with withdrawal, unspecified: Secondary | ICD-10-CM

## 2014-12-12 DIAGNOSIS — F1410 Cocaine abuse, uncomplicated: Secondary | ICD-10-CM

## 2014-12-12 DIAGNOSIS — D696 Thrombocytopenia, unspecified: Secondary | ICD-10-CM

## 2014-12-12 LAB — COMPREHENSIVE METABOLIC PANEL
ALT: 159 U/L — AB (ref 17–63)
AST: 204 U/L — AB (ref 15–41)
Albumin: 2.8 g/dL — ABNORMAL LOW (ref 3.5–5.0)
Alkaline Phosphatase: 190 U/L — ABNORMAL HIGH (ref 38–126)
Anion gap: 6 (ref 5–15)
BUN: 8 mg/dL (ref 6–20)
CHLORIDE: 98 mmol/L — AB (ref 101–111)
CO2: 30 mmol/L (ref 22–32)
CREATININE: 0.75 mg/dL (ref 0.61–1.24)
Calcium: 8.2 mg/dL — ABNORMAL LOW (ref 8.9–10.3)
GFR calc Af Amer: >60 mL/min (ref >60)
GFR calc non Af Amer: >60 mL/min (ref >60)
Glucose, Bld: 96 mg/dL (ref 65–99)
POTASSIUM: 4 mmol/L (ref 3.5–5.1)
SODIUM: 134 mmol/L — AB (ref 135–145)
Total Bilirubin: 1.4 mg/dL — ABNORMAL HIGH (ref 0.3–1.2)
Total Protein: 6.5 g/dL (ref 6.5–8.1)

## 2014-12-12 LAB — CBC
HCT: 43 % (ref 39.0–52.0)
Hemoglobin: 13.9 g/dL (ref 13.0–17.0)
MCH: 32.9 pg (ref 26.0–34.0)
MCHC: 32.3 g/dL (ref 30.0–36.0)
MCV: 101.7 fL — ABNORMAL HIGH (ref 78.0–100.0)
PLATELETS: 83 10*3/uL — AB (ref 150–400)
RBC: 4.23 MIL/uL (ref 4.22–5.81)
RDW: 14.8 % (ref 11.5–15.5)
WBC: 6.2 10*3/uL (ref 4.0–10.5)

## 2014-12-12 MED ORDER — HYDROCODONE-ACETAMINOPHEN 5-325 MG PO TABS: 1.0000 | ORAL_TABLET | ORAL | Status: DC | PRN

## 2014-12-12 MED ORDER — THIAMINE HCL 100 MG PO TABS: 100.0000 mg | ORAL_TABLET | Freq: Every day | ORAL | Status: DC

## 2014-12-12 NOTE — Discharge Summary (Signed)
Physician Discharge Summary  Chad Marks RUE:[id number] DOB: June 22, 1970 DOA: 12/08/2014  PCP: No primary care provider on file.  Admit date: 12/08/2014 Discharge date: 12/12/2014  Time spent: 45 minutes  Recommendations for Outpatient Follow-up:  1. Non specific R liver lesion, needs further workup and review of [facility] , pt reports this being a known finding, FU AFP and get MRI abdomen as outpatient 2. [facility] 9/15, please FU 2D ECHO   Discharge Diagnoses:  Active Problems:   Alcohol withdrawal   Hypokalemia   Thrombocytopenia   Cocaine abuse   Transaminasemia   Non specific Liver lesion   Chronic abd pain  Discharge Condition: stable  Diet recommendation: low sodium  Filed Weights   12/08/14 1755 12/08/14 2326  Weight: 108.41 kg (239 lb) 111.131 kg (245 lb)    History of present illness:  44 year old male with a history of polysubstance abuse including tobacco, alcohol, and cocaine presented to the emergency department with approximately one-week history of vomiting and diarrhea and requesting detox  Hospital Course:   Alcohol withdrawal -treated with Ativan CIWA protocol -clinically improved -advised ETOH cessation  Sinus Pauses -2-4 second pauses noted on telemetry, he was asymptomatic during these pauses -evaluated by Cardiology and since asymptomatic not felt to require a pacemeker -2D ECHo ordered and just completed, patient is extremely anxious to get discharged, ECHO results pending, will need this followed up as outpatient too  Hypokalemia -Repleted  Nausea/vomiting/abdominal pain/Transaminasemia -Normal lipase -Right upper quadrant ultrasound--mild GB wall thicken without pericholecystic fluid, sludge or stones -Likely due to alcoholic gastritis Improved with supportive care only -incidentally he was noted to have a non specific R liver lesion on ultrasound, patient reports being aware of this from prior workup in [place], I  advised him about the importance of having this worked up to exclude malignancy, needs FU of AFP and get MRI abdomen as outpatient. -we made him a FU at the Wellness clinic for next week  Thrombocytopenia  -secondary to myelosuppression from alcohol use and underlying cirrhosis  -B12--379 -HIV--neg -Hep B surface antigen--negative -Hep C antibody--positive, needs further workup and management  Polysubstance abuse  -Including cocaine, ETOH  and tobacco  -Cessation discussed    Consultations:  Cardiology EP  Discharge Exam: Filed Vitals:   12/12/14 1002  BP: 136/89  Pulse: 92  Temp: 98.7 F (37.1 C)  Resp: 18    General: AAOx3 Cardiovascular: S1S2/RRR Respiratory: CTAB  Discharge Instructions   Discharge Instructions    Diet - low sodium heart healthy    Complete by:  As directed      Increase activity slowly    Complete by:  As directed           Current Discharge Medication List    START taking these medications   Details  HYDROcodone-acetaminophen (NORCO/VICODIN) 5-325 MG per tablet Take 1 tablet by mouth every 4 (four) hours as needed for moderate pain. Qty: 10 tablet, Refills: 0    thiamine 100 MG tablet Take 1 tablet (100 mg total) by mouth daily. Qty: 30 tablet, Refills: 0      CONTINUE these medications which have NOT CHANGED   Details  esomeprazole (NEXIUM) 20 MG capsule Take 20 mg by mouth 2 (two) times daily as needed (starts with 1 cap daily, takes 1 addt'l as needed).      STOP taking these medications     omeprazole (PRILOSEC OTC) 20 MG tablet        Allergies  Allergen Reactions  . Penicillins Other (See Comments)    Told not to take med   Follow-up Information    Follow up with Marily Lente, NP On 01/09/2015.   Specialty:  Nurse Practitioner   Why:  at [employer] information:   [address] [place] [address] [phone]       Follow up with [facility]    .   Contact  information:   [address] [place] [address] [phone]      Please follow up.   Why:  Call to schedule an appointment in the next two weeks.       Follow up with [facility]. Go on 12/20/2014.   Specialty:  Internal Medicine   Why:  at 10:30am for a hospital follow up appointment    Contact information:   [id number] [place] [address] [phone]       The results of significant diagnostics from this hospitalization (including imaging, microbiology, ancillary and laboratory) are listed below for reference.    Significant Diagnostic Studies: US Abdomen Limited Ruq  2015-01-02   CLINICAL DATA:  44 year old male with abdominal pain. Medical history indicates Cirrhosis. Initial encounter.  EXAM: US ABDOMEN LIMITED - RIGHT UPPER QUADRANT  COMPARISON:  [facility] CT Abdomen and Pelvis 07/27/2008  FINDINGS: Gallbladder:  Mild wall thickening up to 5 mm. No sludge or echogenic stones. No sonographic Murphy sign elicited. No pericholecystic fluid.  Common bile duct:  Diameter: Suboptimally visualized.  4-5 mm, normal.  Liver:  Echogenic liver. No intrahepatic biliary ductal dilatation. Right lobe round and mildly lobulated echogenic area measuring 3.6 x 3.4 x 3.7 cm (image 32). There does appear to be some internal vascularity on power Doppler (image 35. This lesion was not evident on the noncontrast 2010 study. No other discrete liver lesion.  Other findings: Negative visible right kidney  IMPRESSION: 1. Hepatic steatosis. Nonspecific 3.7 cm discrete liver lesion in the right lobe. CT Abdomen and Pelvis with IV contrast may characterize further. Failing that, an outpatient liver MRI without and with contrast would be recommended. 2. Diffuse gallbladder wall thickening, favor secondary to chronic liver disease in this setting. No gallstones or biliary obstruction.   Electronically Signed    By: Odessa Fleming M.D.   On: January 02, 2015 08:42    Microbiology: Recent Results (from the past 240 hour(s))  MRSA PCR Screening     Status: None   Collection Time: 01/02/15  5:46 AM  Result Value Ref Range Status   MRSA by PCR NEGATIVE NEGATIVE Final    Comment:        The GeneXpert MRSA Assay (FDA approved for NASAL specimens only), is one component of a comprehensive MRSA colonization surveillance program. It is not intended to diagnose MRSA infection nor to guide or monitor treatment for MRSA infections.      Labs: Basic Metabolic Panel:  Recent Labs Lab 12/08/14 1810 12/09/14 0615 01-02-15 0600 12/11/14 0312 12/12/14 0525  NA 134* 135 137 138 134*  K 3.1* 3.5 3.9 4.5 4.0  CL 100* 103 104 103 98*  CO2 25 27 26 28 30   GLUCOSE 141* 101* 85 94 96  BUN <5* <5* <5* <5* 8  CREATININE 0.72 0.65 0.58* 0.66 0.75  CALCIUM 8.5* 7.6* 8.0* 8.3* 8.2*  MG  --   --   --  1.9  --  Liver Function Tests:  Recent Labs Lab 12/08/14 1810 12/09/14 0615 12/10/14 0600 12/11/14 0312 12/12/14 0525  AST 408* 318* 344* 279* 204*  ALT 200* 156* 174* 173* 159*  ALKPHOS 239* 198* 213* 211* 190*  BILITOT 1.5* 1.2 1.2 1.3* 1.4*  PROT 7.3 6.0* 6.1* 6.6 6.5  ALBUMIN 3.4* 2.6* 2.7* 2.8* 2.8*    Recent Labs Lab 12/08/14 2352  LIPASE 17*   No results for input(s): AMMONIA in the last 168 hours. CBC:  Recent Labs Lab 12/08/14 1810 12/09/14 0615 12/10/14 0600 12/12/14 0525  WBC 8.1 4.9 4.2 6.2  HGB 16.2 13.3 13.3 13.9  HCT 47.3 41.2 40.9 43.0  MCV 98.5 100.5* 100.7* 101.7*  PLT 107* 77* 64* 83*   Cardiac Enzymes: No results for input(s): CKTOTAL, CKMB, CKMBINDEX, TROPONINI in the last 168 hours. BNP: BNP (last 3 results) No results for input(s): BNP in the last 8760 hours.  ProBNP (last 3 results) No results for input(s): PROBNP in the last 8760 hours.  CBG: No results for input(s): GLUCAP in the last 168 hours.     SignedZannie Cove  [facility] 12/12/2014, 12:44 PM

## 2014-12-12 NOTE — Telephone Encounter (Signed)
Call received from [facility], CM requesting a hospital follow up appointment.  An appointment was scheduled for 12/20/14 @ 1030 w/ Dr Venetia Night at the [facility] and the information was added to the AVS.  Update provided to C. Royal, CM

## 2014-12-12 NOTE — Progress Notes (Signed)
Late Entry 12/12/2014 6:30 PM  Reviewed discharge information with patient, including follow-up appointments, medications, and discharge instructions.  Pt verbalized understanding of material.  IV was removed.  Pt already fully dressed as he was already anticipating discharge.  Pt gathered his belongings into a patient belongings bag and was escorted to the front desk, where a member of the nursing staff was to escort him along with security to obtain his medications at the outpatient pharmacy.  Cab voucher given to patient as well.   Theadora Rama

## 2014-12-12 NOTE — Care Management Note (Signed)
Case Management Note  Patient Details  Name: Llewyn Heap MRN: [id number] Date of Birth: 1970-04-09  Subjective/Objective:          CM following for progression and d/c planning.          Action/Plan: Attempted to arrange an appointment at [facility] and [facility], however no appointments are available so the pt will need to call the center in the next 2-3 weeks. Will discuss this with pt . Also attempted to schedule this pt with family practice however they have no appointments available for the next three months. I will also provide pt with a list of local clinics and doctors who accept pts for minimal copays.  This CM also placed a call to [facility] on [address] and their appointments are available only after the end of October.        Expected Discharge Date:  12/12/14               Expected Discharge Plan:  Home/Self Care  In-House Referral:  Clinical Social Work  Discharge planning Services  CM Consult  Post Acute Care Choice:  NA Choice offered to:  NA  DME Arranged:    DME Agency:     HH Arranged:    HH Agency:     Status of Service:  Completed, signed off  Medicare Important Message Given:    Date Medicare IM Given:    Medicare IM give by:    Date Additional Medicare IM Given:    Additional Medicare Important Message give by:     If discussed at Long Length of Stay Meetings, dates discussed:    Additional Comments:  Starlyn Skeans, RN 12/12/2014, 11:08 AM

## 2014-12-12 NOTE — Progress Notes (Signed)
12/12/2014 6:23 PM  Patient was discharged by Charge Nurse. Patient was then escorted by this RN and Security to get personal belongings that were in Safe. Patient verified all his belongings and counted his money numerous times before we left the area. Then both this RN and Security escorted patient to [facility]. Patient paid and received his medication. Called [facility]. This RN gave the patient his taxi voucher provided by Child psychotherapist. Patient stated that he was fine to wait alone outside the pharmacy for his taxi to arrive. All patient belongings were left with him.  [employer], RN-BC, RN3 [facility] [address] Phone [phone]  (Late Entry)

## 2014-12-12 NOTE — Progress Notes (Signed)
  Echocardiogram 2D Echocardiogram has been performed.  Chad Marks 12/12/2014, 2:10 PM

## 2014-12-12 NOTE — Clinical Social Work Note (Signed)
Patient medically stable for discharge home today and requesting assistance with transportation. Patient provided with cab voucher to get home.   Genelle Bal, MSW, LCSW Licensed Clinical Social Worker Clinical Social Work Department [employer] [phone]

## 2014-12-13 LAB — AFP TUMOR MARKER: AFP-Tumor Marker: 8.8 ng/mL — ABNORMAL HIGH (ref 0.0–8.3)

## 2014-12-15 ENCOUNTER — Emergency Department (HOSPITAL_COMMUNITY)
Admission: EM | Admit: 2014-12-15 | Discharge: 2014-12-15 | Disposition: A | Discharge status: Home / Self Care | Payer: Medicaid Other | Attending: Emergency Medicine | Admitting: Emergency Medicine

## 2014-12-15 DIAGNOSIS — Z720 Tobacco use: Secondary | ICD-10-CM | POA: Insufficient documentation

## 2014-12-15 DIAGNOSIS — F419 Anxiety disorder, unspecified: Secondary | ICD-10-CM | POA: Diagnosis not present

## 2014-12-15 DIAGNOSIS — R1084 Generalized abdominal pain: Secondary | ICD-10-CM | POA: Diagnosis not present

## 2014-12-15 DIAGNOSIS — Z880 Allergy status to penicillin: Secondary | ICD-10-CM | POA: Insufficient documentation

## 2014-12-15 DIAGNOSIS — K625 Hemorrhage of anus and rectum: Secondary | ICD-10-CM | POA: Insufficient documentation

## 2014-12-15 LAB — COMPREHENSIVE METABOLIC PANEL
ALBUMIN: 3.7 g/dL (ref 3.5–5.0)
ALT: 102 U/L — AB (ref 17–63)
AST: 84 U/L — AB (ref 15–41)
Alkaline Phosphatase: 193 U/L — ABNORMAL HIGH (ref 38–126)
Anion gap: 6 (ref 5–15)
BUN: 7 mg/dL (ref 6–20)
CHLORIDE: 102 mmol/L (ref 101–111)
CO2: 27 mmol/L (ref 22–32)
CREATININE: 0.55 mg/dL — AB (ref 0.61–1.24)
Calcium: 8.5 mg/dL — ABNORMAL LOW (ref 8.9–10.3)
GFR calc Af Amer: >60 mL/min (ref >60)
GFR calc non Af Amer: >60 mL/min (ref >60)
GLUCOSE: 109 mg/dL — AB (ref 65–99)
POTASSIUM: 3.5 mmol/L (ref 3.5–5.1)
Sodium: 135 mmol/L (ref 135–145)
Total Bilirubin: 0.8 mg/dL (ref 0.3–1.2)
Total Protein: 7.6 g/dL (ref 6.5–8.1)

## 2014-12-15 LAB — POC OCCULT BLOOD, ED: Fecal Occult Bld: POSITIVE — AB

## 2014-12-15 LAB — URINALYSIS, ROUTINE W REFLEX MICROSCOPIC
Bilirubin Urine: NEGATIVE
Glucose, UA: NEGATIVE mg/dL
HGB URINE DIPSTICK: NEGATIVE
Ketones, ur: NEGATIVE mg/dL
LEUKOCYTES UA: NEGATIVE
NITRITE: NEGATIVE
PROTEIN: NEGATIVE mg/dL
Specific Gravity, Urine: 1.01 (ref 1.005–1.030)
UROBILINOGEN UA: 1 mg/dL (ref 0.0–1.0)
pH: 7 (ref 5.0–8.0)

## 2014-12-15 LAB — CBC
HEMATOCRIT: 44 % (ref 39.0–52.0)
Hemoglobin: 14.6 g/dL (ref 13.0–17.0)
MCH: 33.3 pg (ref 26.0–34.0)
MCHC: 33.2 g/dL (ref 30.0–36.0)
MCV: 100.5 fL — AB (ref 78.0–100.0)
PLATELETS: 109 10*3/uL — AB (ref 150–400)
RBC: 4.38 MIL/uL (ref 4.22–5.81)
RDW: 14.9 % (ref 11.5–15.5)
WBC: 10 10*3/uL (ref 4.0–10.5)

## 2014-12-15 LAB — TYPE AND SCREEN
ABO/RH(D): O POS
Antibody Screen: NEGATIVE

## 2014-12-15 LAB — ABO/RH: ABO/RH(D): O POS

## 2014-12-15 MED ORDER — LORAZEPAM 2 MG/ML IJ SOLN
0.5000 mg | Freq: Once | INTRAMUSCULAR | Status: AC
  Administered 2014-12-15: 0.5 mg via INTRAVENOUS
  Filled 2014-12-15: qty 1

## 2014-12-15 MED ORDER — DICYCLOMINE HCL 10 MG/ML IM SOLN
20.0000 mg | Freq: Once | INTRAMUSCULAR | Status: AC
  Administered 2014-12-15: 20 mg via INTRAMUSCULAR
  Filled 2014-12-15: qty 2

## 2014-12-15 MED ORDER — SODIUM CHLORIDE 0.9 % IV BOLUS (SEPSIS)
1000.0000 mL | Freq: Once | INTRAVENOUS | Status: AC
  Administered 2014-12-15: 1000 mL via INTRAVENOUS

## 2014-12-15 NOTE — ED Provider Notes (Signed)
CSN: [id number]     Arrival date & time 12/15/14  1007 History   First MD Initiated Contact with Patient 12/15/14 1016     Chief Complaint  Patient presents with  . Rectal Bleeding    HPI  Patient presents with concern of ongoing diffuse abdominal pain, bright red blood per rectum. Per, the patient was evaluated, admitted here 1 week ago for similar concerns. He states that since discharge she continues to have episodic bright red blood per rectum, diffuse generalized abdominal pain. Patient has not yet seen his primary care physician for scheduled follow-up. No new syncope, chest pain, dyspnea. Patient states that he stopped drinking after his recent admission. No confusion, disorientation.  Past Medical History  Diagnosis Date  . Cirrhosis   . Alcohol abuse    No past surgical history on file. No family history on file. Social History  Substance Use Topics  . Smoking status: Current Every Day Smoker    Types: Cigarettes  . Smokeless tobacco: Not on file  . Alcohol Use: Yes    Review of Systems  Constitutional:       Per HPI, otherwise negative  HENT:       Per HPI, otherwise negative  Respiratory:       Per HPI, otherwise negative  Cardiovascular:       Per HPI, otherwise negative  Gastrointestinal: Positive for abdominal pain and blood in stool. Negative for vomiting and rectal pain.  Endocrine:       Negative aside from HPI  Genitourinary:       Neg aside from HPI   Musculoskeletal:       Per HPI, otherwise negative  Skin: Negative.   Neurological: Negative for syncope.  Psychiatric/Behavioral: The patient is nervous/anxious.       Allergies  Penicillins  Home Medications   Prior to Admission medications   Medication Sig Start Date End Date Taking? Authorizing Provider  esomeprazole (NEXIUM) 40 MG capsule Take 40 mg by mouth every morning.   Yes Historical Provider, MD  HYDROcodone-acetaminophen (NORCO/VICODIN) 5-325 MG per tablet Take 1 tablet by  mouth every 4 (four) hours as needed for moderate pain. Patient not taking: Reported on 12/15/2014 12/12/14   Zannie Cove, MD  thiamine 100 MG tablet Take 1 tablet (100 mg total) by mouth daily. Patient not taking: Reported on 12/15/2014 12/12/14   Zannie Cove, MD   BP 160/91 mmHg  Pulse 94  Temp(Src) 98.5 F (36.9 C) (Oral)  Resp 23  SpO2 88% Physical Exam  Constitutional: He is oriented to person, place, and time. He appears well-developed. No distress.  HENT:  Head: Normocephalic and atraumatic.  Eyes: Conjunctivae and EOM are normal.  Cardiovascular: Normal rate and regular rhythm.   Pulmonary/Chest: Effort normal. No stridor. No respiratory distress.  Abdominal: He exhibits no distension.    Musculoskeletal: He exhibits no edema.  Neurological: He is alert and oriented to person, place, and time.  Skin: Skin is warm and dry.  Psychiatric: His mood appears anxious.  Nursing note and vitals reviewed.   ED Course  Procedures (including critical care time) Labs Review Labs Reviewed  COMPREHENSIVE METABOLIC PANEL - Abnormal; Notable for the following:    Glucose, Bld 109 (*)    Creatinine, Ser 0.55 (*)    Calcium 8.5 (*)    AST 84 (*)    ALT 102 (*)    Alkaline Phosphatase 193 (*)    All other components within normal limits  CBC - Abnormal;  Notable for the following:    MCV 100.5 (*)    Platelets 109 (*)    All other components within normal limits  POC OCCULT BLOOD, ED - Abnormal; Notable for the following:    Fecal Occult Bld POSITIVE (*)    All other components within normal limits  URINALYSIS, ROUTINE W REFLEX MICROSCOPIC (NOT AT [facility])  TYPE AND SCREEN  ABO/RH    Imaging Review No results found. I have personally reviewed and evaluated these images and lab results as part of my medical decision-making.    Hemoccult-positive, but hemoglobin is actually greater than his recent evaluation. On repeat exam the patient is anxious, but in no distress.  2:48  PM Patient calm, no acute changes.  Patient has a few skipped beats on monitor, but was asymp.  This occurred during his recent hospitalization as well.    MDM  Patient chronic abdominal pain presents several days after recent admission with concern of ongoing abdominal pain, rectal bleeding. Here the patient is awake, alert, hemodynamically stable, in no distress. Patient is a non-peritoneal abdomen, and with his history of chronic pain, post abuse, there is low suspicion for occult pathology. Patient is Hemoccult-positive, but his hemoglobin is stable/increased from prior evaluation. Patient has previously scheduled follow-up with primary care in a few days, and was discharged in stable condition to follow-up with gastroenterology as well.   Gerhard Munch, MD 12/15/14 [id number]

## 2014-12-15 NOTE — ED Notes (Signed)
Bed: WA09 Expected date: 12/15/14 Expected time: 9:59 AM Means of arrival: Ambulance Comments: HTN, N/V

## 2014-12-15 NOTE — ED Notes (Signed)
Patient ambulated to bathroom without difficulty.

## 2014-12-15 NOTE — ED Notes (Signed)
Per EMS- Patient reports he had a tarry stool yesterday and today, but was recently hospitalized for the same. Patient reports N/V since discharge from the hospital 3 days ago. Patient was given zofran 4 mg IV prior to arrival to the ED with relief.

## 2014-12-15 NOTE — ED Notes (Signed)
Pt states he is still in pain and is requesting pain medication.  He also states that he would like some Ativan due to anxiety.  Will speak with PA.

## 2014-12-15 NOTE — ED Notes (Signed)
RN at bedside to collect labs

## 2014-12-15 NOTE — Discharge Instructions (Signed)
As discussed, your evaluation today has been largely reassuring.  But, it is important that you monitor your condition carefully, and do not hesitate to return to the ED if you develop new, or concerning changes in your condition.  Otherwise, please follow-up with your physician and the gastroenterologists for appropriate ongoing care.

## 2014-12-20 ENCOUNTER — Inpatient Hospital Stay: Payer: Medicaid Other | Admitting: Family Medicine

## 2015-01-07 ENCOUNTER — Encounter: Payer: Self-pay | Admitting: *Deleted

## 2015-01-09 ENCOUNTER — Encounter: Payer: Medicaid Other | Admitting: Nurse Practitioner

## 2015-01-14 ENCOUNTER — Encounter: Payer: Self-pay | Admitting: Nurse Practitioner

## 2015-01-17 ENCOUNTER — Encounter: Payer: Self-pay | Admitting: Nurse Practitioner

## 2015-01-23 ENCOUNTER — Encounter: Payer: Self-pay | Admitting: Nurse Practitioner

## 2015-04-05 ENCOUNTER — Inpatient Hospital Stay (HOSPITAL_COMMUNITY)
Admission: EM | Admit: 2015-04-05 | Discharge: 2015-04-09 | DRG: 871 | Disposition: A | Discharge status: Home / Self Care | Payer: Medicaid Other | Attending: Internal Medicine | Admitting: Internal Medicine

## 2015-04-05 ENCOUNTER — Inpatient Hospital Stay (HOSPITAL_COMMUNITY): Payer: Medicaid Other

## 2015-04-05 ENCOUNTER — Encounter (HOSPITAL_COMMUNITY): Payer: Self-pay | Admitting: Emergency Medicine

## 2015-04-05 DIAGNOSIS — R188 Other ascites: Secondary | ICD-10-CM | POA: Insufficient documentation

## 2015-04-05 DIAGNOSIS — R609 Edema, unspecified: Secondary | ICD-10-CM | POA: Diagnosis not present

## 2015-04-05 DIAGNOSIS — Z79899 Other long term (current) drug therapy: Secondary | ICD-10-CM

## 2015-04-05 DIAGNOSIS — A419 Sepsis, unspecified organism: Principal | ICD-10-CM | POA: Diagnosis present

## 2015-04-05 DIAGNOSIS — R140 Abdominal distension (gaseous): Secondary | ICD-10-CM | POA: Diagnosis not present

## 2015-04-05 DIAGNOSIS — F1410 Cocaine abuse, uncomplicated: Secondary | ICD-10-CM | POA: Diagnosis present

## 2015-04-05 DIAGNOSIS — K7290 Hepatic failure, unspecified without coma: Secondary | ICD-10-CM | POA: Diagnosis present

## 2015-04-05 DIAGNOSIS — R109 Unspecified abdominal pain: Secondary | ICD-10-CM | POA: Diagnosis present

## 2015-04-05 DIAGNOSIS — L03116 Cellulitis of left lower limb: Secondary | ICD-10-CM | POA: Diagnosis present

## 2015-04-05 DIAGNOSIS — F1020 Alcohol dependence, uncomplicated: Secondary | ICD-10-CM | POA: Diagnosis present

## 2015-04-05 DIAGNOSIS — I5033 Acute on chronic diastolic (congestive) heart failure: Secondary | ICD-10-CM | POA: Diagnosis present

## 2015-04-05 DIAGNOSIS — D6959 Other secondary thrombocytopenia: Secondary | ICD-10-CM | POA: Diagnosis present

## 2015-04-05 DIAGNOSIS — F1010 Alcohol abuse, uncomplicated: Secondary | ICD-10-CM | POA: Diagnosis present

## 2015-04-05 DIAGNOSIS — F17210 Nicotine dependence, cigarettes, uncomplicated: Secondary | ICD-10-CM | POA: Diagnosis present

## 2015-04-05 DIAGNOSIS — L0390 Cellulitis, unspecified: Secondary | ICD-10-CM | POA: Diagnosis not present

## 2015-04-05 DIAGNOSIS — K2920 Alcoholic gastritis without bleeding: Secondary | ICD-10-CM | POA: Diagnosis present

## 2015-04-05 DIAGNOSIS — F1110 Opioid abuse, uncomplicated: Secondary | ICD-10-CM | POA: Diagnosis present

## 2015-04-05 DIAGNOSIS — L03115 Cellulitis of right lower limb: Secondary | ICD-10-CM | POA: Diagnosis present

## 2015-04-05 DIAGNOSIS — K7030 Alcoholic cirrhosis of liver without ascites: Secondary | ICD-10-CM | POA: Diagnosis present

## 2015-04-05 DIAGNOSIS — I441 Atrioventricular block, second degree: Secondary | ICD-10-CM | POA: Diagnosis present

## 2015-04-05 DIAGNOSIS — R7401 Elevation of levels of liver transaminase levels: Secondary | ICD-10-CM

## 2015-04-05 DIAGNOSIS — K219 Gastro-esophageal reflux disease without esophagitis: Secondary | ICD-10-CM | POA: Diagnosis present

## 2015-04-05 DIAGNOSIS — R1084 Generalized abdominal pain: Secondary | ICD-10-CM

## 2015-04-05 DIAGNOSIS — E876 Hypokalemia: Secondary | ICD-10-CM | POA: Diagnosis present

## 2015-04-05 DIAGNOSIS — D696 Thrombocytopenia, unspecified: Secondary | ICD-10-CM | POA: Diagnosis present

## 2015-04-05 DIAGNOSIS — R1013 Epigastric pain: Secondary | ICD-10-CM | POA: Diagnosis not present

## 2015-04-05 DIAGNOSIS — K7460 Unspecified cirrhosis of liver: Secondary | ICD-10-CM | POA: Diagnosis present

## 2015-04-05 DIAGNOSIS — R740 Nonspecific elevation of levels of transaminase and lactic acid dehydrogenase [LDH]: Secondary | ICD-10-CM

## 2015-04-05 DIAGNOSIS — K652 Spontaneous bacterial peritonitis: Secondary | ICD-10-CM

## 2015-04-05 LAB — CBC WITH DIFFERENTIAL/PLATELET
BASOS ABS: 0.1 10*3/uL (ref 0.0–0.1)
Basophils Relative: 1 %
Eosinophils Absolute: 0.1 10*3/uL (ref 0.0–0.7)
Eosinophils Relative: 1 %
HEMATOCRIT: 40.6 % (ref 39.0–52.0)
Hemoglobin: 13.8 g/dL (ref 13.0–17.0)
LYMPHS PCT: 21 %
Lymphs Abs: 2.6 10*3/uL (ref 0.7–4.0)
MCH: 34.2 pg — ABNORMAL HIGH (ref 26.0–34.0)
MCHC: 34 g/dL (ref 30.0–36.0)
MCV: 100.5 fL — AB (ref 78.0–100.0)
MONO ABS: 1 10*3/uL (ref 0.1–1.0)
MONOS PCT: 8 %
NEUTROS ABS: 8.4 10*3/uL — AB (ref 1.7–7.7)
Neutrophils Relative %: 69 %
Platelets: 87 10*3/uL — ABNORMAL LOW (ref 150–400)
RBC: 4.04 MIL/uL — ABNORMAL LOW (ref 4.22–5.81)
RDW: 17.2 % — AB (ref 11.5–15.5)
WBC: 12.1 10*3/uL — ABNORMAL HIGH (ref 4.0–10.5)

## 2015-04-05 LAB — I-STAT CG4 LACTIC ACID, ED
Lactic Acid, Venous: 2.48 mmol/L (ref 0.5–2.0)
Lactic Acid, Venous: 3.74 mmol/L (ref 0.5–2.0)

## 2015-04-05 LAB — URINALYSIS, ROUTINE W REFLEX MICROSCOPIC
BILIRUBIN URINE: NEGATIVE
Glucose, UA: NEGATIVE mg/dL
KETONES UR: NEGATIVE mg/dL
LEUKOCYTES UA: NEGATIVE
NITRITE: NEGATIVE
Protein, ur: NEGATIVE mg/dL
Specific Gravity, Urine: 1.006 (ref 1.005–1.030)
pH: 5.5 (ref 5.0–8.0)

## 2015-04-05 LAB — URINE MICROSCOPIC-ADD ON
Bacteria, UA: NONE SEEN
WBC UA: NONE SEEN WBC/hpf (ref 0–5)

## 2015-04-05 LAB — COMPREHENSIVE METABOLIC PANEL
ALBUMIN: 2.7 g/dL — AB (ref 3.5–5.0)
ALK PHOS: 285 U/L — AB (ref 38–126)
ALT: 57 U/L (ref 17–63)
ANION GAP: 12 (ref 5–15)
AST: 200 U/L — ABNORMAL HIGH (ref 15–41)
BILIRUBIN TOTAL: 3.2 mg/dL — AB (ref 0.3–1.2)
CALCIUM: 7.6 mg/dL — AB (ref 8.9–10.3)
CO2: 26 mmol/L (ref 22–32)
Chloride: 95 mmol/L — ABNORMAL LOW (ref 101–111)
Creatinine, Ser: 0.7 mg/dL (ref 0.61–1.24)
GFR calc Af Amer: >60 mL/min (ref >60)
GLUCOSE: 176 mg/dL — AB (ref 65–99)
Potassium: 3.4 mmol/L — ABNORMAL LOW (ref 3.5–5.1)
Sodium: 133 mmol/L — ABNORMAL LOW (ref 135–145)
TOTAL PROTEIN: 7.5 g/dL (ref 6.5–8.1)

## 2015-04-05 LAB — LACTIC ACID, PLASMA
Lactic Acid, Venous: 3 mmol/L (ref 0.5–2.0)
Lactic Acid, Venous: 2.6 mmol/L (ref 0.5–2.0)

## 2015-04-05 LAB — APTT: APTT: 36 s (ref 24–37)

## 2015-04-05 LAB — CBC
HCT: 42.1 % (ref 39.0–52.0)
HEMOGLOBIN: 13.8 g/dL (ref 13.0–17.0)
MCH: 33.6 pg (ref 26.0–34.0)
MCHC: 32.8 g/dL (ref 30.0–36.0)
MCV: 102.4 fL — ABNORMAL HIGH (ref 78.0–100.0)
Platelets: 107 10*3/uL — ABNORMAL LOW (ref 150–400)
RBC: 4.11 MIL/uL — ABNORMAL LOW (ref 4.22–5.81)
RDW: 17.2 % — AB (ref 11.5–15.5)
WBC: 11.7 10*3/uL — AB (ref 4.0–10.5)

## 2015-04-05 LAB — PROTIME-INR
INR: 1.36 (ref 0.00–1.49)
PROTHROMBIN TIME: 16.9 s — AB (ref 11.6–15.2)

## 2015-04-05 LAB — LIPASE, BLOOD: Lipase: 25 U/L (ref 11–51)

## 2015-04-05 LAB — PROCALCITONIN: PROCALCITONIN: 0.16 ng/mL

## 2015-04-05 MED ORDER — SODIUM CHLORIDE 0.9 % IV BOLUS (SEPSIS)
500.0000 mL | Freq: Once | INTRAVENOUS | Status: AC
  Administered 2015-04-05: 500 mL via INTRAVENOUS

## 2015-04-05 MED ORDER — VITAMIN B-1 100 MG PO TABS
100.0000 mg | ORAL_TABLET | Freq: Every day | ORAL | Status: DC
  Administered 2015-04-06 – 2015-04-09 (×4): 100 mg via ORAL
  Filled 2015-04-05 (×4): qty 1

## 2015-04-05 MED ORDER — THIAMINE HCL 100 MG/ML IJ SOLN
100.0000 mg | Freq: Every day | INTRAMUSCULAR | Status: DC
  Administered 2015-04-05: 100 mg via INTRAVENOUS
  Filled 2015-04-05: qty 2

## 2015-04-05 MED ORDER — ADULT MULTIVITAMIN W/MINERALS CH
1.0000 | ORAL_TABLET | Freq: Every day | ORAL | Status: DC
  Administered 2015-04-05 – 2015-04-09 (×5): 1 via ORAL
  Filled 2015-04-05 (×5): qty 1

## 2015-04-05 MED ORDER — VANCOMYCIN HCL 10 G IV SOLR
1250.0000 mg | Freq: Three times a day (TID) | INTRAVENOUS | Status: DC
  Administered 2015-04-06 – 2015-04-09 (×9): 1250 mg via INTRAVENOUS
  Filled 2015-04-05 (×15): qty 1250

## 2015-04-05 MED ORDER — METRONIDAZOLE IN NACL 5-0.79 MG/ML-% IV SOLN
500.0000 mg | Freq: Once | INTRAVENOUS | Status: DC
  Filled 2015-04-05: qty 100

## 2015-04-05 MED ORDER — SPIRONOLACTONE 25 MG PO TABS
100.0000 mg | ORAL_TABLET | Freq: Every day | ORAL | Status: DC
  Administered 2015-04-05 – 2015-04-06 (×2): 100 mg via ORAL
  Filled 2015-04-05 (×2): qty 4

## 2015-04-05 MED ORDER — PANTOPRAZOLE SODIUM 40 MG PO TBEC
40.0000 mg | DELAYED_RELEASE_TABLET | Freq: Every day | ORAL | Status: DC
  Administered 2015-04-05 – 2015-04-09 (×5): 40 mg via ORAL
  Filled 2015-04-05 (×5): qty 1

## 2015-04-05 MED ORDER — SODIUM CHLORIDE 0.9 % IV BOLUS (SEPSIS)
500.0000 mL | Freq: Once | INTRAVENOUS | Status: AC
  Administered 2015-04-06: 500 mL via INTRAVENOUS

## 2015-04-05 MED ORDER — DEXTROSE 5 % IV SOLN
2.0000 g | Freq: Three times a day (TID) | INTRAVENOUS | Status: DC
  Administered 2015-04-06 – 2015-04-08 (×7): 2 g via INTRAVENOUS
  Filled 2015-04-05 (×12): qty 2

## 2015-04-05 MED ORDER — FUROSEMIDE 10 MG/ML IJ SOLN
40.0000 mg | Freq: Every day | INTRAMUSCULAR | Status: DC
  Administered 2015-04-05 – 2015-04-09 (×5): 40 mg via INTRAVENOUS
  Filled 2015-04-05 (×5): qty 4

## 2015-04-05 MED ORDER — ONDANSETRON HCL 4 MG PO TABS: 4.0000 mg | ORAL_TABLET | Freq: Four times a day (QID) | ORAL | Status: DC | PRN

## 2015-04-05 MED ORDER — ONDANSETRON HCL 4 MG/2ML IJ SOLN
4.0000 mg | Freq: Four times a day (QID) | INTRAMUSCULAR | Status: DC | PRN
  Administered 2015-04-06 – 2015-04-08 (×5): 4 mg via INTRAVENOUS
  Filled 2015-04-05 (×5): qty 2

## 2015-04-05 MED ORDER — METRONIDAZOLE IN NACL 5-0.79 MG/ML-% IV SOLN
500.0000 mg | Freq: Three times a day (TID) | INTRAVENOUS | Status: DC
  Administered 2015-04-06 (×2): 500 mg via INTRAVENOUS
  Filled 2015-04-05 (×2): qty 100

## 2015-04-05 MED ORDER — VANCOMYCIN HCL 10 G IV SOLR
2000.0000 mg | Freq: Once | INTRAVENOUS | Status: DC
  Filled 2015-04-05 (×2): qty 2000

## 2015-04-05 MED ORDER — MORPHINE SULFATE (PF) 4 MG/ML IV SOLN
4.0000 mg | Freq: Once | INTRAVENOUS | Status: AC
  Administered 2015-04-05: 4 mg via INTRAVENOUS
  Filled 2015-04-05: qty 1

## 2015-04-05 MED ORDER — VANCOMYCIN HCL IN DEXTROSE 1-5 GM/200ML-% IV SOLN
1000.0000 mg | Freq: Once | INTRAVENOUS | Status: DC
Start: 2015-04-05 — End: 2015-04-05

## 2015-04-05 MED ORDER — FOLIC ACID 1 MG PO TABS
1.0000 mg | ORAL_TABLET | Freq: Every day | ORAL | Status: DC
  Administered 2015-04-05 – 2015-04-09 (×5): 1 mg via ORAL
  Filled 2015-04-05 (×5): qty 1

## 2015-04-05 MED ORDER — LORAZEPAM 2 MG/ML IJ SOLN
1.0000 mg | Freq: Four times a day (QID) | INTRAMUSCULAR | Status: AC | PRN
  Administered 2015-04-06 – 2015-04-07 (×4): 1 mg via INTRAVENOUS
  Filled 2015-04-05 (×4): qty 1

## 2015-04-05 MED ORDER — LORAZEPAM 1 MG PO TABS
1.0000 mg | ORAL_TABLET | Freq: Four times a day (QID) | ORAL | Status: AC | PRN
  Administered 2015-04-06 – 2015-04-08 (×4): 1 mg via ORAL
  Filled 2015-04-05 (×4): qty 1

## 2015-04-05 MED ORDER — ALBUMIN HUMAN 25 % IV SOLN
50.0000 g | Freq: Once | INTRAVENOUS | Status: AC
  Administered 2015-04-05: 50 g via INTRAVENOUS
  Filled 2015-04-05: qty 100

## 2015-04-05 MED ORDER — MORPHINE SULFATE (PF) 2 MG/ML IV SOLN
2.0000 mg | INTRAVENOUS | Status: DC | PRN
  Administered 2015-04-05 – 2015-04-07 (×9): 2 mg via INTRAVENOUS
  Filled 2015-04-05 (×9): qty 1

## 2015-04-05 MED ORDER — DEXTROSE 5 % IV SOLN
2.0000 g | Freq: Once | INTRAVENOUS | Status: DC
  Filled 2015-04-05: qty 2

## 2015-04-05 MED ORDER — POTASSIUM CHLORIDE CRYS ER 20 MEQ PO TBCR
40.0000 meq | EXTENDED_RELEASE_TABLET | Freq: Once | ORAL | Status: AC
Start: 2015-04-05 — End: 2015-04-05
  Administered 2015-04-05: 40 meq via ORAL
  Filled 2015-04-05: qty 2

## 2015-04-05 NOTE — Progress Notes (Signed)
Pt has a 20g PIV in the right lateral upper forearm. When I assessed the PIV the line flushed very easily and had great blood return. I will inform the nurse he does not need this IV replaced. Consuello Masseimmons, Clement Deneault M

## 2015-04-05 NOTE — ED Provider Notes (Signed)
CSN: [id number]     Arrival date & time 04/05/15  1226 History   First MD Initiated Contact with Patient 04/05/15 1557     Chief Complaint  Patient presents with  . Abdominal Pain  . Leg Swelling     (Consider location/radiation/quality/duration/timing/severity/associated sxs/prior Treatment) HPI Complains of of abdominal pain, and swelling and bilateral leg pain and swelling onset 2 years ago becoming worse over the past week. He states yellowish fluid is weeping from his legs. He denies any fever. Denies shortness of breath. No trauma he reports his symptoms are from cirrhosis that he's had in the past. He admits to drinking alcohol last night. He ate oatmeal today. He denies any chest pain or shortness of breath. Denies nausea. Pain is worse with standing upright, improved with elevation of his legs. No other associated symptoms. Past Med.ical History  Diagnosis Date  . Cirrhosis (HCC)   . Alcohol abuse   . GERD (gastroesophageal reflux disease)   . Polysubstance abuse     alcohol, cocaine, heroin, marijuana   Past Surgical History  Procedure Laterality Date  . Rotator cuff repair     Family History  Problem Relation Age of Onset  . Cancer Father    Social History  Substance Use Topics  . Smoking status: Current Every Day Smoker    Types: Cigarettes  . Smokeless tobacco: Never Used  . Alcohol Use: 0.0 oz/week    0 Standard drinks or equivalent per week    positive alcohol use. IV heroin use last time 2.5 years ago admits to smoking crack cocaine  Review of Systems  Cardiovascular: Positive for leg swelling.  Gastrointestinal: Positive for abdominal pain and abdominal distention.  Allergic/Immunologic: Positive for immunocompromised state.       Alcoholic  All other systems reviewed and are negative.     Allergies  Penicillins and Amoxicillin  Home Medications   Prior to Admission medications   Medication Sig Start Date End Date Taking? Authorizing Provider   esomeprazole (NEXIUM) 40 MG capsule Take 40 mg by mouth every morning.   Yes Historical Provider, MD  HYDROcodone-acetaminophen (NORCO/VICODIN) 5-325 MG per tablet Take 1 tablet by mouth every 4 (four) hours as needed for moderate pain. 12/12/14  Yes Zannie CovePreetha Joseph, MD   BP 116/89 mmHg  Pulse 119  Temp(Src) 98.6 F (37 C) (Oral)  Resp 16  Ht 5\' 11"  (1.803 m)  Wt 287 lb 3.2 oz (130.273 kg)  BMI 40.07 kg/m2  SpO2 93% Physical Exam  Constitutional: He appears well-developed and well-nourished.  Appears mildly uncomfortable  HENT:  Head: Normocephalic and atraumatic.  Eyes: Conjunctivae are normal. Pupils are equal, round, and reactive to light.  Neck: Neck supple. No tracheal deviation present. No thyromegaly present.  Cardiovascular: Normal rate and regular rhythm.   No murmur heard. Pulmonary/Chest: Effort normal and breath sounds normal.  Abdominal: Soft. Bowel sounds are normal. He exhibits distension. There is no tenderness.  Nontender  Genitourinary: Penis normal.  No scrotal swelling. Scrotum appears normal  Musculoskeletal: Normal range of motion. He exhibits edema. He exhibits no tenderness.  2-3+ pitting edema of bilateral lower extremities. DP pulses 2+ bilaterally.   Neurological: He is alert. Coordination normal.  Skin: Skin is warm and dry. No rash noted.  Minimally reddened at bilateral lateral shins. Lower legs L are not warm or cold comparison to the thighs.  Psychiatric: He has a normal mood and affect.  Nursing note and vitals reviewed.   ED Course  Procedures (  including critical care time) Labs Review Labs Reviewed  COMPREHENSIVE METABOLIC PANEL - Abnormal; Notable for the following:    Sodium 133 (*)    Potassium 3.4 (*)    Chloride 95 (*)    Glucose, Bld 176 (*)    BUN <5 (*)    Calcium 7.6 (*)    Albumin 2.7 (*)    AST 200 (*)    Alkaline Phosphatase 285 (*)    Total Bilirubin 3.2 (*)    All other components within normal limits  CBC - Abnormal;  Notable for the following:    WBC 11.7 (*)    RBC 4.11 (*)    MCV 102.4 (*)    RDW 17.2 (*)    Platelets 107 (*)    All other components within normal limits  URINALYSIS, ROUTINE W REFLEX MICROSCOPIC (NOT AT [facility]) - Abnormal; Notable for the following:    Hgb urine dipstick SMALL (*)    All other components within normal limits  URINE MICROSCOPIC-ADD ON - Abnormal; Notable for the following:    Squamous Epithelial / LPF 0-5 (*)    All other components within normal limits  LIPASE, BLOOD    Imaging Review No results found. I have personally reviewed and evaluated these images and lab results as part of my medical decision-making.   EKG Interpretation None     Results for orders placed or performed during the hospital encounter of 04/05/15  Lipase, blood  Result Value Ref Range   Lipase 25 11 - 51 U/L  Comprehensive metabolic panel  Result Value Ref Range   Sodium 133 (L) 135 - 145 mmol/L   Potassium 3.4 (L) 3.5 - 5.1 mmol/L   Chloride 95 (L) 101 - 111 mmol/L   CO2 26 22 - 32 mmol/L   Glucose, Bld 176 (H) 65 - 99 mg/dL   BUN <5 (L) 6 - 20 mg/dL   Creatinine, Ser 4.09 0.61 - 1.24 mg/dL   Calcium 7.6 (L) 8.9 - 10.3 mg/dL   Total Protein 7.5 6.5 - 8.1 g/dL   Albumin 2.7 (L) 3.5 - 5.0 g/dL   AST 811 (H) 15 - 41 U/L   ALT 57 17 - 63 U/L   Alkaline Phosphatase 285 (H) 38 - 126 U/L   Total Bilirubin 3.2 (H) 0.3 - 1.2 mg/dL   GFR calc non Af Amer >60 >60 mL/min   GFR calc Af Amer >60 >60 mL/min   Anion gap 12 5 - 15  CBC  Result Value Ref Range   WBC 11.7 (H) 4.0 - 10.5 K/uL   RBC 4.11 (L) 4.22 - 5.81 MIL/uL   Hemoglobin 13.8 13.0 - 17.0 g/dL   HCT 91.4 78.2 - 95.6 %   MCV 102.4 (H) 78.0 - 100.0 fL   MCH 33.6 26.0 - 34.0 pg   MCHC 32.8 30.0 - 36.0 g/dL   RDW 21.3 (H) 08.6 - 57.8 %   Platelets 107 (L) 150 - 400 K/uL  Urinalysis, Routine w reflex microscopic (not at [facility])  Result Value Ref Range   Color, Urine YELLOW YELLOW   APPearance CLEAR CLEAR   Specific  Gravity, Urine 1.006 1.005 - 1.030   pH 5.5 5.0 - 8.0   Glucose, UA NEGATIVE NEGATIVE mg/dL   Hgb urine dipstick SMALL (A) NEGATIVE   Bilirubin Urine NEGATIVE NEGATIVE   Ketones, ur NEGATIVE NEGATIVE mg/dL   Protein, ur NEGATIVE NEGATIVE mg/dL   Nitrite NEGATIVE NEGATIVE   Leukocytes, UA NEGATIVE NEGATIVE  Urine  microscopic-add on  Result Value Ref Range   Squamous Epithelial / LPF 0-5 (A) NONE SEEN   WBC, UA NONE SEEN 0 - 5 WBC/hpf   RBC / HPF 0-5 0 - 5 RBC/hpf   Bacteria, UA NONE SEEN NONE SEEN  I-Stat CG4 Lactic Acid, ED  Result Value Ref Range   Lactic Acid, Venous 2.48 (HH) 0.5 - 2.0 mmol/L   Comment NOTIFIED PHYSICIAN    No results found.  mdm  Patiennt does not require emergent paracentesis. I am concerned that he has cellulitis of his legs and his immune compromised No dyspnea.Marland Kitchen He is tachycardic but not lightheaded on standing. Normal blood pressure. Concern for sepsis given lactic acidosis and tachycardia, leukocytosis. Code sepsis called by me. Intravenous antibiotics and intravenous morphine ordered Dr Gonzella Lex consulted  and will evaluate patient in ED. Dx#1 sepsis #2 cellulitis of lower extremities  #3 hypokalemia #4 hyperglycemia #5 thrombocytroprenia #^ alcohol abuse  Doug Sou, MD 04/05/15 1752

## 2015-04-05 NOTE — ED Notes (Addendum)
Pt from home for eval of swelling to abdomen and bilateral legs x1 week, pt states hx of cirrhosis of liver and has had to have paracentesis done in the past. Pitting edema noted to bilateral legs with wheeping on right leg.Pt denies any cp or sob at this time. States some nausea and not able to eat and drink due to pain. Pt states he drank 2-24oz beers last night. nad noted. Pt tearful in triage.

## 2015-04-05 NOTE — Progress Notes (Signed)
CRITICAL VALUE ALERT  Critical value received:  Lactic acid 3.0  Date of notification: 04/05/2015  Time of notification:  0745  Critical value read back: yes  Nurse who received alert:  Jamelle Rushingleticia Derita Michelsen  MD notified (1st page):  challahan  Time of first page:  0749  Time of second page:not yet  Responding MD:  Awaiting response Time MD responded: not yet paged

## 2015-04-05 NOTE — H&P (Signed)
[facility] History and Physical  Jaire Pinkham QQP:[id number] DOB: August 02, 1970 DOA: 04/05/2015  Referring physician: Orlie Dakin PCP: No PCP Per Patient   Chief Complaint: Lower extremity swelling and redness x one week with abdominal distention and pain  HPI:  44 year old male with decompensated alcoholic cirrhosis of liver, GERD, polysubstance abuse including cocaine, heroin and marijuana with ongoing alcohol abuse presented to the ED with almost 1 week of bilateral lower extremity redness and swelling associated with abdominal distention and some pain. Patient reports that he still drinks about 2-3 beers a day. He noticed increased swelling of his legs with redness and some weeping. He has increased abdominal distention with some diffuse pain. Denies subjective fever, chills, nausea, vomiting, hematemesis or melena. Denies any chest pain, palpitations, shortness of breath, bowel or urinary symptoms. Denies any confusion. He reports poor appetite. He lives with a roommate and has been unable to walk around for the past few days. Reports he does not take any medications. Reports that he was previously referred to hospice but since he improved he was discharged home.  Course in the ED Patient was afebrile but was tachycardic in the 120s. Respiratory rate, blood pressure and O2 sats were normal. Blood work showed WBC of 11.7, hemoglobin of 13.8 and platelets of 107. Chemistry shows sodium of 133, K of 3.4, chloride 95, unifying to 0.7. Glucose was 176. Lactic acid was 2.48 . LFTs were abnormal. Patient met criteria for early sepsis. Patient given a dose of IV vancomycin, aztreonam and Flagyl for bilateral lower leg cellulitis and concern for abdominal pain. Hospitalist admission requested.   Review of Systems:  Constitutional: Denies fever, chills, diaphoresis, appetite change and fatigue.  HEENT: Denies visual or hearing symptoms,  congestion, cough, neck pain or  stiffness Respiratory: Denies SOB, DOE, cough, chest tightness,  and wheezing.   Cardiovascular: Denies chest pain, palpitations , increased leg swellings Gastrointestinal: Abdominal pain and distention, Denies nausea, vomiting, abdominal pain, diarrhea, constipation, blood in stool . Genitourinary: Denies dysuria, urgency, frequency, hematuria, flank pain and difficulty urinating.  Endocrine: Denies: hot or cold intolerance, polyuria, polydipsia. Musculoskeletal: Denies myalgias, back pain, joint swelling, arthralgias . Difficulty in ambulation due to leg swellings Skin: Denies pallor, rash and wound.  Neurological: Denies dizziness, seizures, syncope, weakness, light-headedness, numbness and headaches.  Hematological: Denies adenopathy.  Psychiatric/Behavioral: Denies confusion Past Medical History  Diagnosis Date  . Cirrhosis ([place])   . Alcohol abuse   . GERD (gastroesophageal reflux disease)   . Polysubstance abuse     alcohol, cocaine, heroin, marijuana   Past Surgical History  Procedure Laterality Date  . Rotator cuff repair     Social History:  reports that he has been smoking Cigarettes.  He has never used smokeless tobacco. He reports that he drinks alcohol. He reports that he uses illicit drugs (IV, Cocaine, Marijuana, Methamphetamines, and Heroin).  Allergies  Allergen Reactions  . Penicillins Rash    Told not to take med Has patient had a PCN reaction causing immediate rash, facial/tongue/throat swelling, SOB or lightheadedness with hypotension: Yes Has patient had a PCN reaction causing severe rash involving mucus membranes or skin necrosis: Yes. All over body. Has patient had a PCN reaction that required hospitalization: No Has patient had a PCN reaction occurring within the last 10 years: No If all of the above answers are "NO", then may proceed with Cephalosporin use.   Marland Kitchen Amoxicillin Rash    Family History  Problem Relation Age of Onset  . Cancer  Father      Prior to Admission medications   Medication Sig Start Date End Date Taking? Authorizing Provider  esomeprazole (NEXIUM) 40 MG capsule Take 40 mg by mouth every morning.   Yes Historical Provider, MD  HYDROcodone-acetaminophen (NORCO/VICODIN) 5-325 MG per tablet Take 1 tablet by mouth every 4 (four) hours as needed for moderate pain. 12/12/14  Yes Domenic Polite, MD     Physical Exam:  Filed Vitals:   04/05/15 1545 04/05/15 1600 04/05/15 1630 04/05/15 1645  BP: 121/77 128/85 129/83 132/78  Pulse: 119 124 118 114  Temp:      TempSrc:      Resp:  22 15 16   Height:      Weight:      SpO2: 94% 97% 95% 95%    Constitutional: Vital signs reviewed. Middle aged male appears fatigued, not in distress HEENT: no pallor, no icterus, moist oral mucosa, no cervical lymphadenopathy Cardiovascular: S1 and S2 tachycardic, no murmurs or gallops Chest: CTAB, no wheezes, rales, or rhonchi Abdominal: Soft. Distended abdomen with mild diffuse tenderness, bowel sounds present GU: no CVA tenderness Ext: Diffuse erythema of bilateral legs including feet with some warmth and weeping  Neurological: A&O x3, no tremors  Labs on Admission:  Basic Metabolic Panel:  Recent Labs Lab 04/05/15 1250  NA 133*  K 3.4*  CL 95*  CO2 26  GLUCOSE 176*  BUN <5*  CREATININE 0.70  CALCIUM 7.6*   Liver Function Tests:  Recent Labs Lab 04/05/15 1250  AST 200*  ALT 57  ALKPHOS 285*  BILITOT 3.2*  PROT 7.5  ALBUMIN 2.7*    Recent Labs Lab 04/05/15 1250  LIPASE 25   No results for input(s): AMMONIA in the last 168 hours. CBC:  Recent Labs Lab 04/05/15 1250  WBC 11.7*  HGB 13.8  HCT 42.1  MCV 102.4*  PLT 107*   Cardiac Enzymes: No results for input(s): CKTOTAL, CKMB, CKMBINDEX, TROPONINI in the last 168 hours. BNP: Invalid input(s): POCBNP CBG: No results for input(s): GLUCAP in the last 168 hours.  Radiological Exams on Admission: No results found.  EKG:  Pending  Assessment/Plan  Principal Problem:   Sepsis ([place]) Admit to telemetry. Early sepsis likely secondary to cellulitis of the legs and possible SBP. Sepsis pathway initiated in the ED. Given his volume overload I have not given him fluid resuscitation. Received IV vancomycin, aztreonam and Flagyl in the ED. I will continue him on vancomycin and aztreonam (patient allergy to penicillin). -Follow blood cultures. Follow repeat lactic acid level. -Covering empirically for possible SBP with history and exam. IR consult for paracentesis. (Both therapeutic and diagnostic)  Bilateral lower leg cellulitis As above. Empiric vancomycin and aztreonam. Check Dopplers to rule out DVT.  decompensated alcoholic liver cirrhosis  ongoing alcohol use. . Not on any medications and does not follow-up as outpatient in early.check INR. Check abdominal ultrasound. We will obtain diagnostic and therapeutic paracentesis. Will order IV albumin prior to paracentesis. Ordered IV Lasix and Aldactone. Also had right liver lesion on ultrasound during previous hospitalization. Monitor LFTs. Overall prognosis is very poor.    alcohol abuse Was admitted in September with alcohol Withdrawal. Monitor on CIWA. Added thiamine, folate and multivitamin.  Alcoholic gastritis Added PPI   Polysubstance abuse Has a history of cocaine and heroine use. Check urine for drug screen.  Hypokalemia Replenish          Diet:low sodium  DVT prophylaxis: none. Low platelets and significant cellulitis of b/l legs  Code Status: full code Family Communication: None at bedside Disposition Plan: admit to telemetry. Prognosis is guarded.  Louellen Molder [facility] Pager [phone]  Total time spent on admission :70 minutes  If 7PM-7AM, please contact night-coverage [email or web address] Password [email or web address] 04/05/2015, 5:29 PM

## 2015-04-05 NOTE — Progress Notes (Signed)
CRITICAL VALUE ALERT  Critical value received: lactic acid 2.6  Date of notification:  04/05/2015  Time of notification: 02300  Critical value read back: yes  Nurse who received alert:  Jamelle Rushingleticia Lashawne Dura  MD notified (1st page):  Challahan  Time of first page:  2305MD notified (2nd page): not yet  Time of second page: not yet  Responding MD: awaiting response Time MD responded:  Not yet

## 2015-04-05 NOTE — Consult Note (Addendum)
ANTIBIOTIC CONSULT NOTE - INITIAL  Pharmacy Consult for Vancomycin, Aztreonam, Flagyl Indication: sepsis, cellulitis  Allergies  Allergen Reactions  . Penicillins Rash    Told not to take med Has patient had a PCN reaction causing immediate rash, facial/tongue/throat swelling, SOB or lightheadedness with hypotension: Yes Has patient had a PCN reaction causing severe rash involving mucus membranes or skin necrosis: Yes. All over body. Has patient had a PCN reaction that required hospitalization: No Has patient had a PCN reaction occurring within the last 10 years: No If all of the above answers are "NO", then may proceed with Cephalosporin use.   Marland Kitchen. Amoxicillin Rash    Patient Measurements: Height: 5\' 11"  (180.3 cm) Weight: 287 lb 3.2 oz (130.273 kg) IBW/kg (Calculated) : 75.3  Vital Signs: Temp: 98.6 F (37 C) (12/30 1521) Temp Source: Oral (12/30 1521) BP: 132/78 mmHg (12/30 1645) Pulse Rate: 114 (12/30 1645) Intake/Output from previous day:   Intake/Output from this shift:    Labs:  Recent Labs  04/05/15 1250  WBC 11.7*  HGB 13.8  PLT 107*  CREATININE 0.70   Estimated Creatinine Clearance: 160.5 mL/min (by C-G formula based on Cr of 0.7).  Microbiology: No results found for this or any previous visit (from the past 720 hour(s)).  Medical History: Past Medical History  Diagnosis Date  . Cirrhosis (HCC)   . Alcohol abuse   . GERD (gastroesophageal reflux disease)   . Polysubstance abuse     alcohol, cocaine, heroin, marijuana   Assessment: 44yom presents to the ED with bilateral lower extremity swelling and pitting edema. Right leg weeping. Noted to have elevated WBC and lactic acid of 2.48. Code sepsis called. He will begin vancomycin, aztreonam, and flagyl. Renal function wnl.  Goal of Therapy:  Vancomycin trough level 15-20 mcg/ml  Plan:  1) Vancomycin 2g IV x 1 then 1250mg  IV q8 2) Aztreonam 2g IV q8 3) Flagyl 500mg  IV q8 4) Follow renal  function, cultures, LOT, level if needed  Fredrik RiggerMarkle, Briahnna Harries Sue 04/05/2015,5:12 PM   Pharmacy Code Sepsis Protocol  Time of code sepsis page: 1704 Time of antibiotic delivery: 1730  Were antibiotics ordered at the time of the code sepsis page? YES Was it required to contact the physician? []  Physician not contacted []  Physician contacted to order antibiotics for code sepsis []  Physician contacted to recommend changing antibiotics   Nurse education provided: [x]  Minutes left to administer antibiotics to achieve 1 hour goal [x]  Correct order of antibiotic administration [x]  Antibiotic Y-site compatibilities     Fredrik RiggerMarkle, Rc Amison Sue, PharmD 04/05/2015, 5:35 PM

## 2015-04-06 ENCOUNTER — Inpatient Hospital Stay (HOSPITAL_COMMUNITY): Payer: Medicaid Other

## 2015-04-06 DIAGNOSIS — E876 Hypokalemia: Secondary | ICD-10-CM

## 2015-04-06 DIAGNOSIS — K7290 Hepatic failure, unspecified without coma: Secondary | ICD-10-CM

## 2015-04-06 DIAGNOSIS — L03116 Cellulitis of left lower limb: Secondary | ICD-10-CM

## 2015-04-06 DIAGNOSIS — F1010 Alcohol abuse, uncomplicated: Secondary | ICD-10-CM

## 2015-04-06 DIAGNOSIS — R1013 Epigastric pain: Secondary | ICD-10-CM

## 2015-04-06 DIAGNOSIS — D696 Thrombocytopenia, unspecified: Secondary | ICD-10-CM

## 2015-04-06 DIAGNOSIS — I441 Atrioventricular block, second degree: Secondary | ICD-10-CM

## 2015-04-06 DIAGNOSIS — R609 Edema, unspecified: Secondary | ICD-10-CM

## 2015-04-06 LAB — CBC
HCT: 36.8 % — ABNORMAL LOW (ref 39.0–52.0)
Hemoglobin: 12.1 g/dL — ABNORMAL LOW (ref 13.0–17.0)
MCH: 33.4 pg (ref 26.0–34.0)
MCHC: 32.9 g/dL (ref 30.0–36.0)
MCV: 101.7 fL — ABNORMAL HIGH (ref 78.0–100.0)
PLATELETS: 81 10*3/uL — AB (ref 150–400)
RBC: 3.62 MIL/uL — AB (ref 4.22–5.81)
RDW: 17.6 % — ABNORMAL HIGH (ref 11.5–15.5)
WBC: 7.5 10*3/uL (ref 4.0–10.5)

## 2015-04-06 LAB — COMPREHENSIVE METABOLIC PANEL
ALK PHOS: 214 U/L — AB (ref 38–126)
ALT: 43 U/L (ref 17–63)
AST: 151 U/L — ABNORMAL HIGH (ref 15–41)
Albumin: 2.6 g/dL — ABNORMAL LOW (ref 3.5–5.0)
Anion gap: 9 (ref 5–15)
BILIRUBIN TOTAL: 5 mg/dL — AB (ref 0.3–1.2)
BUN: <5 mg/dL — ABNORMAL LOW (ref 6–20)
CALCIUM: 7.7 mg/dL — AB (ref 8.9–10.3)
CO2: 30 mmol/L (ref 22–32)
CREATININE: 0.67 mg/dL (ref 0.61–1.24)
Chloride: 99 mmol/L — ABNORMAL LOW (ref 101–111)
GFR calc non Af Amer: >60 mL/min (ref >60)
GLUCOSE: 105 mg/dL — AB (ref 65–99)
Potassium: 3.6 mmol/L (ref 3.5–5.1)
SODIUM: 138 mmol/L (ref 135–145)
Total Protein: 6.6 g/dL (ref 6.5–8.1)

## 2015-04-06 LAB — RAPID URINE DRUG SCREEN, HOSP PERFORMED
Amphetamines: NOT DETECTED
Barbiturates: NOT DETECTED
Benzodiazepines: NOT DETECTED
Cocaine: NOT DETECTED
Opiates: POSITIVE — AB
Tetrahydrocannabinol: NOT DETECTED

## 2015-04-06 MED ORDER — VANCOMYCIN HCL 10 G IV SOLR
2000.0000 mg | Freq: Once | INTRAVENOUS | Status: AC
  Administered 2015-04-06: 2000 mg via INTRAVENOUS
  Filled 2015-04-06: qty 2000

## 2015-04-06 MED ORDER — ASPIRIN 81 MG PO CHEW
81.0000 mg | CHEWABLE_TABLET | Freq: Every day | ORAL | Status: DC
  Administered 2015-04-06 – 2015-04-09 (×4): 81 mg via ORAL
  Filled 2015-04-06 (×4): qty 1

## 2015-04-06 MED ORDER — METOPROLOL TARTRATE 25 MG PO TABS: 25.0000 mg | ORAL_TABLET | Freq: Two times a day (BID) | ORAL | Status: DC

## 2015-04-06 MED ORDER — SPIRONOLACTONE 25 MG PO TABS
50.0000 mg | ORAL_TABLET | Freq: Every day | ORAL | Status: DC
  Administered 2015-04-07 – 2015-04-09 (×3): 50 mg via ORAL
  Filled 2015-04-06 (×3): qty 2

## 2015-04-06 MED ORDER — CHLORDIAZEPOXIDE HCL 5 MG PO CAPS
10.0000 mg | ORAL_CAPSULE | Freq: Three times a day (TID) | ORAL | Status: DC
  Administered 2015-04-06 – 2015-04-09 (×10): 10 mg via ORAL
  Filled 2015-04-06 (×10): qty 2

## 2015-04-06 MED ORDER — HEPARIN SODIUM (PORCINE) 5000 UNIT/ML IJ SOLN
5000.0000 [IU] | Freq: Three times a day (TID) | INTRAMUSCULAR | Status: DC
  Administered 2015-04-06 – 2015-04-09 (×9): 5000 [IU] via SUBCUTANEOUS
  Filled 2015-04-06 (×9): qty 1

## 2015-04-06 NOTE — Consult Note (Addendum)
Reason for Consult: Mobitz Type II heart block Primary Cardiologist: saw Dr. Curt Bears in the hospital in September Referring Physician: Dr. Duard Larsen Chad Marks is an 44 y.o. male.  HPI: Chad Marks is a 44 yo man with alcohol abuse, polysubstance abuse, alcoholic cirrhosis, GERD who presented to the ER 12/30 with 1 week of bilateral feet/leg redness/swelling and weeping and mild pain. He also noted abdominal distension. He has been trying to cut back on his drinking and most recently was drinking 2x24 oz beers daily. He denied on admission and now fevers/chills/nausea/vomiting/diarrhea/dark stools/bright red blood per rectum. He tells me he does noticed occasional palpitations and has had some chest pain that feels sharp, last seconds (brief) and is not necessarily associated with activity the last time 2-3 days ago. Cardiology consulted today given mobitz II AV block on telemetry. He tells me he doesn't know that he's been told he has heart block or heart issues. He denies family history of cardiovascular disease. He wants to quite drinking. He endorses some depression. He denies syncope or fatigue. In the hospital he's been treated with aztreonam and metronidazole and vancomycin for presumed infection and skin/soft tissue infection. He is on no AV nodal blocking agents. He is also on CIWA protocol with librium and lasix 40 mg/spironolactone 50 mg with good diuresis.   Of note, Chad Marks was seen in consultation by EP in September by D. Camnitz for the same finding: Mobitz Type II heart block; however, he was also asymptomatic at that time as well with the longest pause 4 seconds. On telemetry here, the longest pause seen was < 3 seconds. Given his polysubstance abuse he was not felt to be a good candidate for any procedure. He also was asymptomatic.      Past Medical History  Diagnosis Date  . Cirrhosis ([place])   . Alcohol abuse   . GERD (gastroesophageal reflux disease)   . Polysubstance abuse      alcohol, cocaine, heroin, marijuana    Past Surgical History  Procedure Laterality Date  . Rotator cuff repair      Family History  Problem Relation Age of Onset  . Cancer Father     Social History:  reports that he has been smoking Cigarettes.  He has never used smokeless tobacco. He reports that he drinks alcohol. He reports that he uses illicit drugs (IV, Cocaine, Marijuana, Methamphetamines, and Heroin).  Allergies:  Allergies  Allergen Reactions  . Penicillins Rash    Told not to take med Has patient had a PCN reaction causing immediate rash, facial/tongue/throat swelling, SOB or lightheadedness with hypotension: Yes Has patient had a PCN reaction causing severe rash involving mucus membranes or skin necrosis: Yes. All over body. Has patient had a PCN reaction that required hospitalization: No Has patient had a PCN reaction occurring within the last 10 years: No If all of the above answers are "NO", then may proceed with Cephalosporin use.   Marland Kitchen Amoxicillin Rash    Medications:  I have reviewed the patient's current medications. Prior to Admission:  Prescriptions prior to admission  Medication Sig Dispense Refill Last Dose  . esomeprazole (NEXIUM) 40 MG capsule Take 40 mg by mouth every morning.   04/05/2015 at Unknown time  . HYDROcodone-acetaminophen (NORCO/VICODIN) 5-325 MG per tablet Take 1 tablet by mouth every 4 (four) hours as needed for moderate pain. 10 tablet 0 04/05/2015 at Unknown time   Scheduled: . aspirin  81 mg Oral Daily  . aztreonam  2  g Intravenous 3 times per day  . chlordiazePOXIDE  10 mg Oral TID  . folic acid  1 mg Oral Daily  . furosemide  40 mg Intravenous Daily  . heparin subcutaneous  5,000 Units Subcutaneous 3 times per day  . multivitamin with minerals  1 tablet Oral Daily  . pantoprazole  40 mg Oral Daily  . [START ON 04/07/2015] spironolactone  50 mg Oral Daily  . thiamine  100 mg Oral Daily   Or  . thiamine  100 mg Intravenous Daily   . vancomycin  1,250 mg Intravenous Q8H   Continuous:   Results for orders placed or performed during the hospital encounter of 04/05/15 (from the past 48 hour(s))  Urinalysis, Routine w reflex microscopic (not at [facility])     Status: Abnormal   Collection Time: 04/05/15 12:38 PM  Result Value Ref Range   Color, Urine YELLOW YELLOW   APPearance CLEAR CLEAR   Specific Gravity, Urine 1.006 1.005 - 1.030   pH 5.5 5.0 - 8.0   Glucose, UA NEGATIVE NEGATIVE mg/dL   Hgb urine dipstick SMALL (A) NEGATIVE   Bilirubin Urine NEGATIVE NEGATIVE   Ketones, ur NEGATIVE NEGATIVE mg/dL   Protein, ur NEGATIVE NEGATIVE mg/dL   Nitrite NEGATIVE NEGATIVE   Leukocytes, UA NEGATIVE NEGATIVE  Urine microscopic-add on     Status: Abnormal   Collection Time: 04/05/15 12:38 PM  Result Value Ref Range   Squamous Epithelial / LPF 0-5 (A) NONE SEEN   WBC, UA NONE SEEN 0 - 5 WBC/hpf   RBC / HPF 0-5 0 - 5 RBC/hpf   Bacteria, UA NONE SEEN NONE SEEN  Urine culture     Status: None (Preliminary result)   Collection Time: 04/05/15 12:38 PM  Result Value Ref Range   Specimen Description URINE, CLEAN CATCH    Special Requests NONE    Culture TOO YOUNG TO READ    Report Status PENDING   Lipase, blood     Status: None   Collection Time: 04/05/15 12:50 PM  Result Value Ref Range   Lipase 25 11 - 51 U/L  Comprehensive metabolic panel     Status: Abnormal   Collection Time: 04/05/15 12:50 PM  Result Value Ref Range   Sodium 133 (L) 135 - 145 mmol/L   Potassium 3.4 (L) 3.5 - 5.1 mmol/L   Chloride 95 (L) 101 - 111 mmol/L   CO2 26 22 - 32 mmol/L   Glucose, Bld 176 (H) 65 - 99 mg/dL   BUN <5 (L) 6 - 20 mg/dL   Creatinine, Ser 0.70 0.61 - 1.24 mg/dL   Calcium 7.6 (L) 8.9 - 10.3 mg/dL   Total Protein 7.5 6.5 - 8.1 g/dL   Albumin 2.7 (L) 3.5 - 5.0 g/dL   AST 200 (H) 15 - 41 U/L   ALT 57 17 - 63 U/L   Alkaline Phosphatase 285 (H) 38 - 126 U/L   Total Bilirubin 3.2 (H) 0.3 - 1.2 mg/dL   GFR calc non Af Amer >60 >60  mL/min   GFR calc Af Amer >60 >60 mL/min    Comment: (NOTE) The eGFR has been calculated using the CKD EPI equation. This calculation has not been validated in all clinical situations. eGFR's persistently <60 mL/min signify possible Chronic Kidney Disease.    Anion gap 12 5 - 15  CBC     Status: Abnormal   Collection Time: 04/05/15 12:50 PM  Result Value Ref Range   WBC 11.7 (H) 4.0 -  10.5 K/uL   RBC 4.11 (L) 4.22 - 5.81 MIL/uL   Hemoglobin 13.8 13.0 - 17.0 g/dL   HCT 42.1 39.0 - 52.0 %   MCV 102.4 (H) 78.0 - 100.0 fL   MCH 33.6 26.0 - 34.0 pg   MCHC 32.8 30.0 - 36.0 g/dL   RDW 17.2 (H) 11.5 - 15.5 %   Platelets 107 (L) 150 - 400 K/uL    Comment: PLATELET COUNT CONFIRMED BY SMEAR  I-Stat CG4 Lactic Acid, ED     Status: Abnormal   Collection Time: 04/05/15  4:52 PM  Result Value Ref Range   Lactic Acid, Venous 2.48 (HH) 0.5 - 2.0 mmol/L   Comment NOTIFIED PHYSICIAN   Lactic acid, plasma     Status: Abnormal   Collection Time: 04/05/15  6:38 PM  Result Value Ref Range   Lactic Acid, Venous 3.0 (HH) 0.5 - 2.0 mmol/L    Comment: CRITICAL RESULT CALLED TO, READ BACK BY AND VERIFIED WITH: TEmilee Hero RN [phone] [id number] GREEN R   Procalcitonin     Status: None   Collection Time: 04/05/15  6:38 PM  Result Value Ref Range   Procalcitonin 0.16 ng/mL    Comment:        Interpretation: PCT (Procalcitonin) <= 0.5 ng/mL: Systemic infection (sepsis) is not likely. Local bacterial infection is possible. (NOTE)         ICU PCT Algorithm               Non ICU PCT Algorithm    ----------------------------     ------------------------------         PCT < 0.25 ng/mL                 PCT < 0.1 ng/mL     Stopping of antibiotics            Stopping of antibiotics       strongly encouraged.               strongly encouraged.    ----------------------------     ------------------------------       PCT level decrease by               PCT < 0.25 ng/mL       >= 80% from peak PCT       OR PCT 0.25 - 0.5  ng/mL          Stopping of antibiotics                                             encouraged.     Stopping of antibiotics           encouraged.    ----------------------------     ------------------------------       PCT level decrease by              PCT >= 0.25 ng/mL       < 80% from peak PCT        AND PCT >= 0.5 ng/mL            Continuin g antibiotics  encouraged.       Continuing antibiotics            encouraged.    ----------------------------     ------------------------------     PCT level increase compared          PCT > 0.5 ng/mL         with peak PCT AND          PCT >= 0.5 ng/mL             Escalation of antibiotics                                          strongly encouraged.      Escalation of antibiotics        strongly encouraged.   CBC WITH DIFFERENTIAL     Status: Abnormal   Collection Time: 04/05/15  6:41 PM  Result Value Ref Range   WBC 12.1 (H) 4.0 - 10.5 K/uL   RBC 4.04 (L) 4.22 - 5.81 MIL/uL   Hemoglobin 13.8 13.0 - 17.0 g/dL   HCT 40.6 39.0 - 52.0 %   MCV 100.5 (H) 78.0 - 100.0 fL   MCH 34.2 (H) 26.0 - 34.0 pg   MCHC 34.0 30.0 - 36.0 g/dL   RDW 17.2 (H) 11.5 - 15.5 %   Platelets 87 (L) 150 - 400 K/uL    Comment: REPEATED TO VERIFY SPECIMEN CHECKED FOR CLOTS CONSISTENT WITH PREVIOUS RESULT    Neutrophils Relative % 69 %   Neutro Abs 8.4 (H) 1.7 - 7.7 K/uL   Lymphocytes Relative 21 %   Lymphs Abs 2.6 0.7 - 4.0 K/uL   Monocytes Relative 8 %   Monocytes Absolute 1.0 0.1 - 1.0 K/uL   Eosinophils Relative 1 %   Eosinophils Absolute 0.1 0.0 - 0.7 K/uL   Basophils Relative 1 %   Basophils Absolute 0.1 0.0 - 0.1 K/uL  I-Stat CG4 Lactic Acid, ED  (not at  [facility])     Status: Abnormal   Collection Time: 04/05/15  6:56 PM  Result Value Ref Range   Lactic Acid, Venous 3.74 (HH) 0.5 - 2.0 mmol/L   Comment NOTIFIED PHYSICIAN   Lactic acid, plasma     Status: Abnormal   Collection Time: 04/05/15  8:47 PM  Result  Value Ref Range   Lactic Acid, Venous 2.6 (HH) 0.5 - 2.0 mmol/L    Comment: CRITICAL RESULT CALLED TO, READ BACK BY AND VERIFIED WITHCindie Laroche RN [phone] GREEN R   Protime-INR     Status: Abnormal   Collection Time: 04/05/15  8:47 PM  Result Value Ref Range   Prothrombin Time 16.9 (H) 11.6 - 15.2 seconds   INR 1.36 0.00 - 1.49  APTT     Status: None   Collection Time: 04/05/15  8:47 PM  Result Value Ref Range   aPTT 36 24 - 37 seconds  Comprehensive metabolic panel     Status: Abnormal   Collection Time: 04/06/15  7:53 AM  Result Value Ref Range   Sodium 138 135 - 145 mmol/L   Potassium 3.6 3.5 - 5.1 mmol/L   Chloride 99 (L) 101 - 111 mmol/L   CO2 30 22 - 32 mmol/L   Glucose, Bld 105 (H) 65 - 99 mg/dL   BUN <5 (L) 6 - 20 mg/dL   Creatinine, Ser 0.67 0.61 - 1.24  mg/dL   Calcium 7.7 (L) 8.9 - 10.3 mg/dL   Total Protein 6.6 6.5 - 8.1 g/dL   Albumin 2.6 (L) 3.5 - 5.0 g/dL   AST 151 (H) 15 - 41 U/L   ALT 43 17 - 63 U/L   Alkaline Phosphatase 214 (H) 38 - 126 U/L   Total Bilirubin 5.0 (H) 0.3 - 1.2 mg/dL   GFR calc non Af Amer >60 >60 mL/min   GFR calc Af Amer >60 >60 mL/min    Comment: (NOTE) The eGFR has been calculated using the CKD EPI equation. This calculation has not been validated in all clinical situations. eGFR's persistently <60 mL/min signify possible Chronic Kidney Disease.    Anion gap 9 5 - 15  CBC     Status: Abnormal   Collection Time: 04/06/15  7:53 AM  Result Value Ref Range   WBC 7.5 4.0 - 10.5 K/uL   RBC 3.62 (L) 4.22 - 5.81 MIL/uL   Hemoglobin 12.1 (L) 13.0 - 17.0 g/dL   HCT 36.8 (L) 39.0 - 52.0 %   MCV 101.7 (H) 78.0 - 100.0 fL   MCH 33.4 26.0 - 34.0 pg   MCHC 32.9 30.0 - 36.0 g/dL   RDW 17.6 (H) 11.5 - 15.5 %   Platelets 81 (L) 150 - 400 K/uL    Comment: CONSISTENT WITH PREVIOUS RESULT  Urine rapid drug screen (hosp performed)     Status: Abnormal   Collection Time: 04/06/15  8:00 AM  Result Value Ref Range   Opiates POSITIVE (A) NONE  DETECTED   Cocaine NONE DETECTED NONE DETECTED   Benzodiazepines NONE DETECTED NONE DETECTED   Amphetamines NONE DETECTED NONE DETECTED   Tetrahydrocannabinol NONE DETECTED NONE DETECTED   Barbiturates NONE DETECTED NONE DETECTED    Comment:        DRUG SCREEN FOR MEDICAL PURPOSES ONLY.  IF CONFIRMATION IS NEEDED FOR ANY PURPOSE, NOTIFY LAB WITHIN 5 DAYS.        LOWEST DETECTABLE LIMITS FOR URINE DRUG SCREEN Drug Class       Cutoff (ng/mL) Amphetamine      1000 Barbiturate      200 Benzodiazepine   175 Tricyclics       102 Opiates          300 Cocaine          300 THC              50     US Abdomen Limited  04/06/2015  CLINICAL DATA:  Subsequent encounter for spontaneous bacterial peritonitis. EXAM: LIMITED ABDOMEN ULTRASOUND FOR ASCITES TECHNIQUE: Limited ultrasound survey for ascites was performed in all four abdominal quadrants. COMPARISON:  04/05/2015. FINDINGS: Focused 4 quadrant ultrasound to assess for intraperitoneal free fluid demonstrates only minimal intraperitoneal free fluid. IMPRESSION: Insufficient fluid for paracentesis. Electronically Signed   By: Misty Stanley M.D.   On: 04/06/2015 10:26   US Abdomen Limited Ruq  04/05/2015  CLINICAL DATA:  44 year old male with decompensated hepatic cirrhosis. EXAM: US ABDOMEN LIMITED - RIGHT UPPER QUADRANT COMPARISON:  Abdominal ultrasound 12/10/2014. FINDINGS: Comment: Image quality is suboptimal secondary to large patient body habitus. Gallbladder: No gallstones or wall thickening visualized. No sonographic Murphy sign noted by sonographer. Common bile duct: Diameter: 2.9 mm in the porta hepatis. Liver: Diffusely echogenic with diffusely heterogeneous hepatic echotexture. Nodular liver contour. No focal lesion identified. IMPRESSION: 1. No acute findings. Specifically, no evidence of gallstones and no findings to suggest acute cholecystitis at this time. 2.  The appearance of the liver suggests underlying cirrhosis, likely with  associated hepatic steatosis. Electronically Signed   By: Vinnie Langton M.D.   On: 04/05/2015 20:42    Review of Systems  Constitutional: Negative for fever and chills.  HENT: Negative for hearing loss and tinnitus.   Eyes: Negative for photophobia and pain.  Respiratory: Negative for cough, hemoptysis and shortness of breath.   Cardiovascular: Positive for palpitations and leg swelling. Negative for chest pain, claudication and PND.  Gastrointestinal: Negative for nausea, vomiting, diarrhea and constipation.  Genitourinary: Negative for dysuria and urgency.  Musculoskeletal: Negative for myalgias.  Skin: Positive for rash.  Neurological: Negative for dizziness, tingling, sensory change and speech change.  Endo/Heme/Allergies: Negative for polydipsia. Bruises/bleeds easily.  Psychiatric/Behavioral: Positive for depression and substance abuse.   Blood pressure 126/71, pulse 111, temperature 99 F (37.2 C), temperature source Oral, resp. rate 18, height 5' 11"  (1.803 m), weight 130.273 kg (287 lb 3.2 oz), SpO2 92 %. Physical Exam  Nursing note and vitals reviewed. Constitutional: He is oriented to person, place, and time. He appears well-developed and well-nourished. No distress.  HENT:  Head: Normocephalic and atraumatic.  Nose: Nose normal.  Mouth/Throat: Oropharynx is clear and moist. No oropharyngeal exudate.  Eyes: Conjunctivae and EOM are normal. Pupils are equal, round, and reactive to light. No scleral icterus.  Neck: Normal range of motion. Neck supple. No JVD present. No tracheal deviation present.  Cardiovascular: Normal rate, regular rhythm and intact distal pulses.  Exam reveals no gallop.   No murmur heard. Respiratory: Effort normal and breath sounds normal. No respiratory distress. He has no wheezes. He has no rales.  GI: Soft. Bowel sounds are normal. There is no tenderness. There is no rebound.  Soft but full abdomen  Musculoskeletal: Normal range of motion. He  exhibits edema.  1+ edema bilateral lower extremities  Neurological: He is alert and oriented to person, place, and time. No cranial nerve deficit. Coordination normal.  Skin: Skin is warm and dry. He is not diaphoretic.  Toes with dried blood, cracks in skin  Psychiatric:  Flat affect  na 138, K 3.6, bun/cr < 5/0.67, ast/alt 151/43, total bili 5, cr 0.67, albumin 2.6, total protein 6.6, alk phos 214, plt 81, wbc 7.5, mcv 102, h/h 12/36.8 utox + opiates Abd [place]: minimal fluid EKG: sinus tachycardia 107, inferior/lateral ST changes subtle, PR interval 148, QRS 90 msec Echo 9/16: EF 65-70%, grade I DD, no significant valvular disease noted  Assessment/Plan: Chad Marks is a 44 yo man with alcoholic cirrhosis, polysubstance abuse, ongoing alcohol use who presents with cellulitis/sepsis being treated with broad spectrum antibiotics and found to have previously noted Mobitz II AV block. Differential is ischemia, medications, progressive conduction system disease, lyme disease, (?alcoholic cardiomyopathy but previous echo not suggestive), infiltrative disease from amyloidosis or sarcoidosis among other etiologies. In young patients with normal QRS duration, sarcoidosis can be present in as many as 30% of patients. For now, he is not a great candidate for a permanent pacemaker given his alcohol abuse/polysubstance abuse combined with presumed active infection, thrombocytopenia. I favor transfer to stepdown, continue to monitor and discuss with EP regarding permanent pacemaker placement, although he is not a good candidate with high risk for bleeding and/or infection at the current time. He currently is not having long pauses (all < 3 seconds) nor is he symptomatic.  Problem List Cellulitis/Sepsis  Alcoholic Cirrhosis  Mobitz Type II Heart block Polysubstance abuse Thrombocytopenia CIWA protocol  Plan 1.  Transfer to stepdown; continue to monitor closely on telemetry 2. No AV nodal blocking agents 3.  Continue to treat infection and continue CIWA protocol 4. Discuss with EP 5. Could pursue infiltrative etiology further with cardiac MRI (sarcoidosis and/or amyloidosis) 6. Daily drug and alcohol cessation counseling 7. No obvious ischemic causes but can trend troponins x3 8. Add on TSH (placed )  Chabeli Barsamian 04/06/2015, 8:54 PM

## 2015-04-06 NOTE — Progress Notes (Signed)
Patient ID: Chad LuxClifton Cansler, male   DOB: 02/11/1971, 44 y.o.   MRN: [id number]   US limited Abd performed No ascites for paracentesis No procedure performed

## 2015-04-06 NOTE — Progress Notes (Signed)
Patient Demographics:    Chad Marks, is a 44 y.o. male, DOB - June 13, 1970, ZOX:[id number]  Admit date - 04/05/2015   Admitting Physician Eddie North, MD  Outpatient Primary MD for the patient is No PCP Per Patient  LOS - 1   Chief Complaint  Patient presents with  . Abdominal Pain  . Leg Swelling        Subjective:    [name] today has, No headache, No chest pain, No abdominal pain - No Nausea, No new weakness tingling or numbness, No Cough - SOB.     Assessment  & Plan :     1. Sepsis from bilateral lower extremity cellulitis. Agree with present antibiotics will continue, follow cultures, sepsis etiology seems to have resolved with stable blood pressure, patient does not appear toxic. Ultrasound of the abdomen was done not enough fluid to be tapped hence doubt he has SBP.  2. History of alcohol abuse, alcoholic liver cirrhosis. Continue Lasix and Aldactone, place on scheduled Librium, CIWA protocol. Seems to be going in early DTs. Counseled to quit alcohol. Outpatient GI follow-up.  3. GERD. On PPI.  4. History of cocaine abuse. Tonsil to continue abstaining, present urine drug screen negative for cocaine.  5. Thrombocytopenia. Due to alcoholic cirrhosis. Monitor no bleeding. On heparin will continue to monitor intermittently.    Code Status : Full  Family Communication  : None present  Disposition Plan  :    Consults  :    Procedures  :   US Abdomen with Cirrhotic Liver, minimal to no Ascites  DVT Prophylaxis  :    Heparin    Lab Results  Component Value Date   PLT 81* 04/06/2015    Inpatient Medications  Scheduled Meds: . aztreonam  2 g Intravenous 3 times per day  . chlordiazePOXIDE  10 mg Oral TID  . folic acid  1 mg Oral Daily  . furosemide  40 mg  Intravenous Daily  . heparin subcutaneous  5,000 Units Subcutaneous 3 times per day  . multivitamin with minerals  1 tablet Oral Daily  . pantoprazole  40 mg Oral Daily  . [START ON 04/07/2015] spironolactone  50 mg Oral Daily  . thiamine  100 mg Oral Daily   Or  . thiamine  100 mg Intravenous Daily  . vancomycin  1,250 mg Intravenous Q8H   Continuous Infusions:  PRN Meds:.LORazepam **OR** LORazepam, morphine injection, [DISCONTINUED] ondansetron **OR** ondansetron (ZOFRAN) IV  Antibiotics  :   Anti-infectives    Start     Dose/Rate Route Frequency Ordered Stop   04/06/15 1700  vancomycin (VANCOCIN) 1,250 mg in sodium chloride 0.9 % 250 mL IVPB     1,250 mg 166.7 mL/hr over 90 Minutes Intravenous Every 8 hours 04/05/15 1733     04/06/15 0845  vancomycin (VANCOCIN) 2,000 mg in sodium chloride 0.9 % 500 mL IVPB     2,000 mg 250 mL/hr over 120 Minutes Intravenous  Once 04/06/15 0841 04/06/15 1109   04/06/15 0200  aztreonam (AZACTAM) 2 g in dextrose 5 % 50 mL IVPB     2 g 100 mL/hr over 30 Minutes Intravenous 3 times per day 04/05/15 1733     04/06/15 0200  metroNIDAZOLE (FLAGYL) IVPB  500 mg  Status:  Discontinued     500 mg 100 mL/hr over 60 Minutes Intravenous Every 8 hours 04/05/15 1733 04/06/15 1136   04/05/15 1715  aztreonam (AZACTAM) 2 g in dextrose 5 % 50 mL IVPB  Status:  Discontinued     2 g 100 mL/hr over 30 Minutes Intravenous  Once 04/05/15 1701 04/06/15 0159   04/05/15 1715  metroNIDAZOLE (FLAGYL) IVPB 500 mg  Status:  Discontinued     500 mg 100 mL/hr over 60 Minutes Intravenous  Once 04/05/15 1701 04/06/15 1136   04/05/15 1715  vancomycin (VANCOCIN) IVPB 1000 mg/200 mL premix  Status:  Discontinued     1,000 mg 200 mL/hr over 60 Minutes Intravenous  Once 04/05/15 1701 04/05/15 1712   04/05/15 1715  vancomycin (VANCOCIN) 2,000 mg in sodium chloride 0.9 % 500 mL IVPB  Status:  Discontinued     2,000 mg 250 mL/hr over 120 Minutes Intravenous  Once 04/05/15 1712 04/06/15  1107        Objective:   Filed Vitals:   04/06/15 0508 04/06/15 0618 04/06/15 0914 04/06/15 1000  BP: 115/64 125/72 131/75   Pulse: 114 105 108 112  Temp: 98.9 F (37.2 C)  99 F (37.2 C)   TempSrc: Oral  Oral   Resp: 18  18   Height:      Weight:      SpO2: 94%       Wt Readings from Last 3 Encounters:  04/05/15 130.273 kg (287 lb 3.2 oz)  12/08/14 111.131 kg (245 lb)  07/03/08 116.574 kg (257 lb)     Intake/Output Summary (Last 24 hours) at 04/06/15 1137 Last data filed at 04/06/15 0600  Gross per 24 hour  Intake   1114 ml  Output    800 ml  Net    314 ml     Physical Exam  Awake Alert, Oriented X 3, No new F.N deficits, Normal affect, does have mild tremors and seems to be going in early DTs [place].AT,PERRAL Supple Neck,No JVD, No cervical lymphadenopathy appriciated.  Symmetrical Chest wall movement, Good air movement bilaterally, CTAB RRR,No Gallops,Rubs or new Murmurs, No Parasternal Heave +ve B.Sounds, Abd Soft, No tenderness, No organomegaly appriciated, No rebound - guarding or rigidity. No Cyanosis,  2+ leg edema bipedal with warmth, multiple toenails were missing chronic fungal infection    Data Review:   Micro Results Recent Results (from the past 240 hour(s))  Urine culture     Status: None (Preliminary result)   Collection Time: 04/05/15 12:38 PM  Result Value Ref Range Status   Specimen Description URINE, CLEAN CATCH  Final   Special Requests NONE  Final   Culture TOO YOUNG TO READ  Final   Report Status PENDING  Incomplete    Radiology Reports [place] Abdomen Limited  04/06/2015  CLINICAL DATA:  Subsequent encounter for spontaneous bacterial peritonitis. EXAM: LIMITED ABDOMEN ULTRASOUND FOR ASCITES TECHNIQUE: Limited ultrasound survey for ascites was performed in all four abdominal quadrants. COMPARISON:  04/05/2015. FINDINGS: Focused 4 quadrant ultrasound to assess for intraperitoneal free fluid demonstrates only minimal intraperitoneal free  fluid. IMPRESSION: Insufficient fluid for paracentesis. Electronically Signed   By: Kennith CenterEric  Mansell M.D.   On: 04/06/2015 10:26   [place] Abdomen Limited Ruq  04/05/2015  CLINICAL DATA:  44 year old male with decompensated hepatic cirrhosis. EXAM: US ABDOMEN LIMITED - RIGHT UPPER QUADRANT COMPARISON:  Abdominal ultrasound 12/10/2014. FINDINGS: Comment: Image quality is suboptimal secondary to large patient body habitus. Gallbladder: No  gallstones or wall thickening visualized. No sonographic Murphy sign noted by sonographer. Common bile duct: Diameter: 2.9 mm in the porta hepatis. Liver: Diffusely echogenic with diffusely heterogeneous hepatic echotexture. Nodular liver contour. No focal lesion identified. IMPRESSION: 1. No acute findings. Specifically, no evidence of gallstones and no findings to suggest acute cholecystitis at this time. 2. The appearance of the liver suggests underlying cirrhosis, likely with associated hepatic steatosis. Electronically Signed   By: Trudie Reed M.D.   On: 04/05/2015 20:42     CBC  Recent Labs Lab 04/05/15 1250 04/05/15 1841 04/06/15 0753  WBC 11.7* 12.1* 7.5  HGB 13.8 13.8 12.1*  HCT 42.1 40.6 36.8*  PLT 107* 87* 81*  MCV 102.4* 100.5* 101.7*  MCH 33.6 34.2* 33.4  MCHC 32.8 34.0 32.9  RDW 17.2* 17.2* 17.6*  LYMPHSABS  --  2.6  --   MONOABS  --  1.0  --   EOSABS  --  0.1  --   BASOSABS  --  0.1  --     Chemistries   Recent Labs Lab 04/05/15 1250 04/06/15 0753  NA 133* 138  K 3.4* 3.6  CL 95* 99*  CO2 26 30  GLUCOSE 176* 105*  BUN <5* <5*  CREATININE 0.70 0.67  CALCIUM 7.6* 7.7*  AST 200* 151*  ALT 57 43  ALKPHOS 285* 214*  BILITOT 3.2* 5.0*   ------------------------------------------------------------------------------------------------------------------ estimated creatinine clearance is 160.5 mL/min (by C-G formula based on Cr of  0.67). ------------------------------------------------------------------------------------------------------------------ No results for input(s): HGBA1C in the last 72 hours. ------------------------------------------------------------------------------------------------------------------ No results for input(s): CHOL, HDL, LDLCALC, TRIG, CHOLHDL, LDLDIRECT in the last 72 hours. ------------------------------------------------------------------------------------------------------------------ No results for input(s): TSH, T4TOTAL, T3FREE, THYROIDAB in the last 72 hours.  Invalid input(s): FREET3 ------------------------------------------------------------------------------------------------------------------ No results for input(s): VITAMINB12, FOLATE, FERRITIN, TIBC, IRON, RETICCTPCT in the last 72 hours.  Coagulation profile  Recent Labs Lab 04/05/15 2047  INR 1.36    No results for input(s): DDIMER in the last 72 hours.  Cardiac Enzymes No results for input(s): CKMB, TROPONINI, MYOGLOBIN in the last 168 hours.  Invalid input(s): CK ------------------------------------------------------------------------------------------------------------------ Invalid input(s): POCBNP   Time Spent in minutes   35   Kaien Pezzullo K M.D on 04/06/2015 at 11:37 AM  Between 7am to 7pm - Pager - [phone]  After 7pm go to [email or web address] - password [facility]  [facility] -  Office  [phone]

## 2015-04-06 NOTE — Progress Notes (Signed)
Discussed w/ Dr Thedore MinsSingh missed beats on tele monitor EKG done shows ST,MD made aware. Asymptomatic.

## 2015-04-06 NOTE — Progress Notes (Signed)
VASCULAR LAB PRELIMINARY  PRELIMINARY  PRELIMINARY  PRELIMINARY  Bilateral lower extremity venous duplex completed.    Preliminary report:  There is no obvious evidence of DVT or of SVT noted in the bilateral lower extremities.   Nicholis Stepanek, RVT 04/06/2015, 9:46 AM

## 2015-04-07 ENCOUNTER — Inpatient Hospital Stay (HOSPITAL_COMMUNITY): Payer: Medicaid Other

## 2015-04-07 DIAGNOSIS — L03115 Cellulitis of right lower limb: Secondary | ICD-10-CM

## 2015-04-07 DIAGNOSIS — L03116 Cellulitis of left lower limb: Secondary | ICD-10-CM

## 2015-04-07 DIAGNOSIS — A419 Sepsis, unspecified organism: Principal | ICD-10-CM

## 2015-04-07 DIAGNOSIS — F1410 Cocaine abuse, uncomplicated: Secondary | ICD-10-CM

## 2015-04-07 DIAGNOSIS — I441 Atrioventricular block, second degree: Secondary | ICD-10-CM

## 2015-04-07 DIAGNOSIS — L0390 Cellulitis, unspecified: Secondary | ICD-10-CM

## 2015-04-07 DIAGNOSIS — I5033 Acute on chronic diastolic (congestive) heart failure: Secondary | ICD-10-CM

## 2015-04-07 DIAGNOSIS — F1010 Alcohol abuse, uncomplicated: Secondary | ICD-10-CM

## 2015-04-07 DIAGNOSIS — R740 Nonspecific elevation of levels of transaminase and lactic acid dehydrogenase [LDH]: Secondary | ICD-10-CM

## 2015-04-07 LAB — COMPREHENSIVE METABOLIC PANEL
ALBUMIN: 2.4 g/dL — AB (ref 3.5–5.0)
ALT: 39 U/L (ref 17–63)
AST: 120 U/L — AB (ref 15–41)
Alkaline Phosphatase: 205 U/L — ABNORMAL HIGH (ref 38–126)
Anion gap: 8 (ref 5–15)
BILIRUBIN TOTAL: 5.7 mg/dL — AB (ref 0.3–1.2)
CHLORIDE: 99 mmol/L — AB (ref 101–111)
CO2: 29 mmol/L (ref 22–32)
CREATININE: 0.64 mg/dL (ref 0.61–1.24)
Calcium: 7.8 mg/dL — ABNORMAL LOW (ref 8.9–10.3)
GFR calc Af Amer: >60 mL/min (ref >60)
GFR calc non Af Amer: >60 mL/min (ref >60)
GLUCOSE: 93 mg/dL (ref 65–99)
POTASSIUM: 3.5 mmol/L (ref 3.5–5.1)
Sodium: 136 mmol/L (ref 135–145)
TOTAL PROTEIN: 6.3 g/dL — AB (ref 6.5–8.1)

## 2015-04-07 LAB — CBC
HEMATOCRIT: 35.9 % — AB (ref 39.0–52.0)
Hemoglobin: 12 g/dL — ABNORMAL LOW (ref 13.0–17.0)
MCH: 34.7 pg — ABNORMAL HIGH (ref 26.0–34.0)
MCHC: 33.4 g/dL (ref 30.0–36.0)
MCV: 103.8 fL — AB (ref 78.0–100.0)
Platelets: 68 10*3/uL — ABNORMAL LOW (ref 150–400)
RBC: 3.46 MIL/uL — AB (ref 4.22–5.81)
RDW: 17.7 % — AB (ref 11.5–15.5)
WBC: 6.9 10*3/uL (ref 4.0–10.5)

## 2015-04-07 LAB — TSH: TSH: 2.152 u[IU]/mL (ref 0.350–4.500)

## 2015-04-07 LAB — URINE CULTURE

## 2015-04-07 LAB — MRSA PCR SCREENING: MRSA BY PCR: NEGATIVE

## 2015-04-07 MED ORDER — POTASSIUM CHLORIDE CRYS ER 20 MEQ PO TBCR
40.0000 meq | EXTENDED_RELEASE_TABLET | Freq: Once | ORAL | Status: AC
Start: 2015-04-07 — End: 2015-04-07
  Administered 2015-04-07: 40 meq via ORAL
  Filled 2015-04-07: qty 2

## 2015-04-07 MED ORDER — LACTULOSE 10 GM/15ML PO SOLN
10.0000 g | Freq: Three times a day (TID) | ORAL | Status: DC
  Administered 2015-04-07 – 2015-04-09 (×5): 10 g via ORAL
  Filled 2015-04-07 (×5): qty 15

## 2015-04-07 MED ORDER — HYDROCODONE-ACETAMINOPHEN 5-325 MG PO TABS
1.0000 | ORAL_TABLET | ORAL | Status: DC | PRN
  Administered 2015-04-07 – 2015-04-08 (×5): 1 via ORAL
  Filled 2015-04-07 (×5): qty 1

## 2015-04-07 NOTE — Progress Notes (Signed)
Patient Name: Chad Marks Date of Encounter: 04/07/2015  Principal Problem:   Sepsis (HCC) Active Problems:   Hypokalemia   Thrombocytopenia (HCC)   Cocaine abuse   Bilateral cellulitis of lower leg   Abdominal pain   Decompensated hepatic cirrhosis (HCC)   Alcohol abuse   Length of Stay: 2  SUBJECTIVE  The patient complains of legs swelling and pain. Denies chest pain, SOB, dizziness or syncope.   CURRENT MEDS . aspirin  81 mg Oral Daily  . aztreonam  2 g Intravenous 3 times per day  . chlordiazePOXIDE  10 mg Oral TID  . folic acid  1 mg Oral Daily  . furosemide  40 mg Intravenous Daily  . heparin subcutaneous  5,000 Units Subcutaneous 3 times per day  . lactulose  10 g Oral TID  . multivitamin with minerals  1 tablet Oral Daily  . pantoprazole  40 mg Oral Daily  . spironolactone  50 mg Oral Daily  . thiamine  100 mg Oral Daily   Or  . thiamine  100 mg Intravenous Daily  . vancomycin  1,250 mg Intravenous Q8H    OBJECTIVE  Filed Vitals:   04/07/15 0500 04/07/15 0600 04/07/15 0800 04/07/15 0805  BP: 111/82 122/75 132/83 132/83  Pulse: 97 82 103 104  Temp:    97.7 F (36.5 C)  TempSrc:    Oral  Resp: 15 15 24 17   Height:      Weight:      SpO2: 97% 96% 95% 94%    Intake/Output Summary (Last 24 hours) at 04/07/15 1015 Last data filed at 04/07/15 0800  Gross per 24 hour  Intake   1130 ml  Output   2175 ml  Net  -1045 ml   Filed Weights   04/05/15 1234 04/07/15 0238  Weight: 287 lb 3.2 oz (130.273 kg) 279 lb 1.6 oz (126.599 kg)    PHYSICAL EXAM  General: Pleasant, NAD. Neuro: Alert and oriented X 3. Moves all extremities spontaneously. Psych: Normal affect. HEENT:  Normal  Neck: Supple without bruits or JVD. Lungs:  Resp regular and unlabored, CTA. Heart: RRR no s3, s4, or murmurs. Abdomen: Soft, non-tender, non-distended, BS + x 4.  Extremities: No clubbing, cyanosis or edema. DP/PT/Radials 2+ and equal bilaterally.  Accessory Clinical  Findings  CBC  Recent Labs  04/05/15 1841 04/06/15 0753 04/07/15 0550  WBC 12.1* 7.5 6.9  NEUTROABS 8.4*  --   --   HGB 13.8 12.1* 12.0*  HCT 40.6 36.8* 35.9*  MCV 100.5* 101.7* 103.8*  PLT 87* 81* 68*   Basic Metabolic Panel  Recent Labs  04/06/15 0753 04/07/15 0550  NA 138 136  K 3.6 3.5  CL 99* 99*  CO2 30 29  GLUCOSE 105* 93  BUN <5* <5*  CREATININE 0.67 0.64  CALCIUM 7.7* 7.8*   Liver Function Tests  Recent Labs  04/06/15 0753 04/07/15 0550  AST 151* 120*  ALT 43 39  ALKPHOS 214* 205*  BILITOT 5.0* 5.7*  PROT 6.6 6.3*  ALBUMIN 2.6* 2.4*    Recent Labs  04/05/15 1250  LIPASE 25    Recent Labs  04/07/15 0550  TSH 2.152    Radiology/Studies  [place] Abdomen Limited  04/06/2015  CLINICAL DATA:  Subsequent encounter for spontaneous bacterial peritonitis. EXAM: LIMITED ABDOMEN ULTRASOUND FOR ASCITES TECHNIQUE: Limited ultrasound survey for ascites was performed in all four abdominal quadrants. COMPARISON:  04/05/2015. FINDINGS: Focused 4 quadrant ultrasound to assess for intraperitoneal free fluid  demonstrates only minimal intraperitoneal free fluid. IMPRESSION: Insufficient fluid for paracentesis. Electronically Signed   By: Kennith Center M.D.   On: 04/06/2015 10:26   Dg Abd Portable 1v  04/07/2015  CLINICAL DATA:  Generalized abdominal bloating and pain. EXAM: PORTABLE ABDOMEN - 1 VIEW COMPARISON:  None. FINDINGS: The bowel gas pattern is normal. No radio-opaque calculi or other significant radiographic abnormality are seen. IMPRESSION: No evidence of bowel obstruction or ileus. Electronically Signed   By: Lupita Raider, M.D.   On: 04/07/2015 09:45   US Abdomen Limited Ruq  04/05/2015  CLINICAL DATA:  45 year old male with decompensated hepatic cirrhosis. EXAM: US ABDOMEN LIMITED - RIGHT UPPER QUADRANT COMPARISON:  Abdominal ultrasound 12/10/2014. FINDINGS: Comment: Image quality is suboptimal secondary to large patient body habitus. Gallbladder: No  gallstones or wall thickening visualized. No sonographic Murphy sign noted by sonographer. Common bile duct: Diameter: 2.9 mm in the porta hepatis. Liver: Diffusely echogenic with diffusely heterogeneous hepatic echotexture. Nodular liver contour. No focal lesion identified. IMPRESSION: 1. No acute findings. Specifically, no evidence of gallstones and no findings to suggest acute cholecystitis at this time. 2. The appearance of the liver suggests underlying cirrhosis, likely with associated hepatic steatosis. Electronically Signed   By: Trudie Reed M.D.   On: 04/05/2015 20:42    TELE: SR to sinus tachycardia with multiple 2:1 and 3:1 blocks with max pause 2.2 seconds     ASSESSMENT AND PLAN  Problem List Cellulitis/Sepsis  Alcoholic Cirrhosis  Mobitz Type II Heart block Polysubstance abuse Thrombocytopenia CIWA protocol  Mr. Vandam is a 45 yo man with alcoholic cirrhosis, polysubstance abuse, ongoing alcohol use who presents with cellulitis/sepsis being treated with broad spectrum antibiotics and found to have previously noted Mobitz II AV block. Differential is ischemia, medications, progressive conduction system disease, lyme disease, (?alcoholic cardiomyopathy but previous echo not suggestive), infiltrative disease from amyloidosis or sarcoidosis among other etiologies. In young patients with normal QRS duration, sarcoidosis can be present in as many as 30% of patients.   He was seen by EP in September for an asymptomatic 2:1 block and was not considered a great candidate for a permanent pacemaker given his alcohol abuse/polysubstance abuse combined with presumed active infection, thrombocytopenia.  He is completely asymptomatic from cardiac standpoint, while his telemetry show SR to sinus tachycardia with multiple 2:1 and 3:1 blocks but max pause was 2.2 seconds. We will follow, no intervention at this time.   He was in a mild acute on chronic diastolic CHF on low dose iv Lasix 40 mg  daily, diuresed 1000 L overnight, he has persistent LE edema sec to cellulitis.  Signed, Lars Masson MD, [facility] 04/07/2015

## 2015-04-07 NOTE — Progress Notes (Signed)
"  Are u going to bring me my pain medication at 22:00?" " Could I have some crackers and peanut butter."  Will evaluate pain issues at 2200 and crackers and peanut butter given. Zofran effective for nausea.

## 2015-04-07 NOTE — Progress Notes (Signed)
[facility].

## 2015-04-07 NOTE — Progress Notes (Signed)
Patient requested something for nerves. "I feel like I just can't relax. And I'm nauseated." Zofran 4mg  given and Ativan 1mg  po given for nerves. CIWA protocal continues.

## 2015-04-07 NOTE — Progress Notes (Signed)
Patient Demographics:    Chad LuxClifton Marks, is a 45 y.o. male, DOB - 1970/07/30, ZOX:[id number]  Admit date - 04/05/2015   Admitting Physician Leroy SeaPrashant K Singh, MD  Outpatient Primary MD for the patient is No PCP Per Patient  LOS - 2   Chief Complaint  Patient presents with  . Abdominal Pain  . Leg Swelling        Subjective:    Chad Marks today has, No headache, No chest pain, No abdominal pain - No Nausea, No new weakness tingling or numbness, No Cough - SOB.     Assessment  & Plan :     1. Sepsis from bilateral lower extremity cellulitis. Agree with present antibiotics will continue, follow cultures, sepsis etiology seems to have resolved with stable blood pressure, patient does not appear toxic. Ultrasound of the abdomen was done not enough fluid to be tapped hence doubt he has SBP.  2. History of alcohol abuse, alcoholic liver cirrhosis. Continue Lasix and Aldactone, place on scheduled Librium, CIWA protocol. Seems to be going in early DTs. Counseled to quit alcohol. Outpatient GI follow-up.  3. GERD. On PPI.  4. History of cocaine abuse. Tonsil to continue abstaining, present urine drug screen negative for cocaine.  5. Thrombocytopenia. Due to alcoholic cirrhosis. Monitor no bleeding. On heparin will continue to monitor intermittently.  6. Second-degree heart block Mobitz type II. Cardiology on board, has been diagnosed previously, due to alcohol abuse cardiology not planning to place a pacemaker etc. He's symptom-free will be monitored. Avoid rate controlling agents.    Code Status : Full  Family Communication  : None present  Disposition Plan  :  Home once DTs resolved  Consults  :  Cardiology  Procedures  :   US Abdomen with Cirrhotic Liver, minimal to no Ascites  DVT  Prophylaxis  :    Heparin    Lab Results  Component Value Date   PLT 68* 04/07/2015    Inpatient Medications  Scheduled Meds: . aspirin  81 mg Oral Daily  . aztreonam  2 g Intravenous 3 times per day  . chlordiazePOXIDE  10 mg Oral TID  . folic acid  1 mg Oral Daily  . furosemide  40 mg Intravenous Daily  . heparin subcutaneous  5,000 Units Subcutaneous 3 times per day  . lactulose  10 g Oral TID  . multivitamin with minerals  1 tablet Oral Daily  . pantoprazole  40 mg Oral Daily  . spironolactone  50 mg Oral Daily  . thiamine  100 mg Oral Daily   Or  . thiamine  100 mg Intravenous Daily  . vancomycin  1,250 mg Intravenous Q8H   Continuous Infusions:  PRN Meds:.LORazepam **OR** LORazepam, morphine injection, [DISCONTINUED] ondansetron **OR** ondansetron (ZOFRAN) IV  Antibiotics  :   Anti-infectives    Start     Dose/Rate Route Frequency Ordered Stop   04/06/15 1700  vancomycin (VANCOCIN) 1,250 mg in sodium chloride 0.9 % 250 mL IVPB     1,250 mg 166.7 mL/hr over 90 Minutes Intravenous Every 8 hours 04/05/15 1733     04/06/15 0845  vancomycin (VANCOCIN) 2,000 mg in sodium chloride 0.9 % 500 mL IVPB     2,000 mg 250 mL/hr over  120 Minutes Intravenous  Once 04/06/15 0841 04/06/15 1109   04/06/15 0200  aztreonam (AZACTAM) 2 g in dextrose 5 % 50 mL IVPB     2 g 100 mL/hr over 30 Minutes Intravenous 3 times per day 04/05/15 1733     04/06/15 0200  metroNIDAZOLE (FLAGYL) IVPB 500 mg  Status:  Discontinued     500 mg 100 mL/hr over 60 Minutes Intravenous Every 8 hours 04/05/15 1733 04/06/15 1136   04/05/15 1715  aztreonam (AZACTAM) 2 g in dextrose 5 % 50 mL IVPB  Status:  Discontinued     2 g 100 mL/hr over 30 Minutes Intravenous  Once 04/05/15 1701 04/06/15 0159   04/05/15 1715  metroNIDAZOLE (FLAGYL) IVPB 500 mg  Status:  Discontinued     500 mg 100 mL/hr over 60 Minutes Intravenous  Once 04/05/15 1701 04/06/15 1136   04/05/15 1715  vancomycin (VANCOCIN) IVPB 1000 mg/200  mL premix  Status:  Discontinued     1,000 mg 200 mL/hr over 60 Minutes Intravenous  Once 04/05/15 1701 04/05/15 1712   04/05/15 1715  vancomycin (VANCOCIN) 2,000 mg in sodium chloride 0.9 % 500 mL IVPB  Status:  Discontinued     2,000 mg 250 mL/hr over 120 Minutes Intravenous  Once 04/05/15 1712 04/06/15 1107        Objective:   Filed Vitals:   04/07/15 0500 04/07/15 0600 04/07/15 0800 04/07/15 0805  BP: 111/82 122/75 132/83 132/83  Pulse: 97 82 103 104  Temp:    97.7 F (36.5 C)  TempSrc:    Oral  Resp: 15 15 24 17   Height:      Weight:      SpO2: 97% 96% 95% 94%    Wt Readings from Last 3 Encounters:  04/07/15 126.599 kg (279 lb 1.6 oz)  12/08/14 111.131 kg (245 lb)  07/03/08 116.574 kg (257 lb)     Intake/Output Summary (Last 24 hours) at 04/07/15 1027 Last data filed at 04/07/15 0800  Gross per 24 hour  Intake   1130 ml  Output   2175 ml  Net  -1045 ml     Physical Exam  Awake Alert, Oriented X 3, No new F.N deficits, Normal affect, does have mild tremors and seems to be going in early DTs [place].AT,PERRAL Supple Neck,No JVD, No cervical lymphadenopathy appriciated.  Symmetrical Chest wall movement, Good air movement bilaterally, CTAB RRR,No Gallops,Rubs or new Murmurs, No Parasternal Heave +ve B.Sounds, Abd Soft, No tenderness, No organomegaly appriciated, No rebound - guarding or rigidity. No Cyanosis,  2+ leg edema bipedal with warmth, multiple toenails were missing chronic fungal infection    Data Review:   Micro Results Recent Results (from the past 240 hour(s))  Urine culture     Status: None (Preliminary result)   Collection Time: 04/05/15 12:38 PM  Result Value Ref Range Status   Specimen Description URINE, CLEAN CATCH  Final   Special Requests NONE  Final   Culture TOO YOUNG TO READ  Final   Report Status PENDING  Incomplete  Blood Culture (routine x 2)     Status: None (Preliminary result)   Collection Time: 04/05/15  6:30 PM  Result Value  Ref Range Status   Specimen Description BLOOD LEFT HAND  Final   Special Requests BOTTLES DRAWN AEROBIC AND ANAEROBIC 4CC  Final   Culture  Setup Time   Final    GRAM POSITIVE COCCI IN CLUSTERS ANAEROBIC BOTTLE ONLY CRITICAL RESULT CALLED TO, READ BACK BY  AND VERIFIED WITH: S STOWE,RN AT 1610 04/07/15 BY L BENFIELD    Culture   Final    STAPHYLOCOCCUS SPECIES (COAGULASE NEGATIVE) THE SIGNIFICANCE OF ISOLATING THIS ORGANISM FROM A SINGLE SET OF BLOOD CULTURES WHEN MULTIPLE SETS ARE DRAWN IS UNCERTAIN. PLEASE NOTIFY THE MICROBIOLOGY DEPARTMENT WITHIN ONE WEEK IF SPECIATION AND SENSITIVITIES ARE REQUIRED.    Report Status PENDING  Incomplete  MRSA PCR Screening     Status: None   Collection Time: 04/07/15  2:53 AM  Result Value Ref Range Status   MRSA by PCR NEGATIVE NEGATIVE Final    Comment:        The GeneXpert MRSA Assay (FDA approved for NASAL specimens only), is one component of a comprehensive MRSA colonization surveillance program. It is not intended to diagnose MRSA infection nor to guide or monitor treatment for MRSA infections.     Radiology Reports US Abdomen Limited  04/06/2015  CLINICAL DATA:  Subsequent encounter for spontaneous bacterial peritonitis. EXAM: LIMITED ABDOMEN ULTRASOUND FOR ASCITES TECHNIQUE: Limited ultrasound survey for ascites was performed in all four abdominal quadrants. COMPARISON:  04/05/2015. FINDINGS: Focused 4 quadrant ultrasound to assess for intraperitoneal free fluid demonstrates only minimal intraperitoneal free fluid. IMPRESSION: Insufficient fluid for paracentesis. Electronically Signed   By: Kennith Center M.D.   On: 04/06/2015 10:26   Dg Abd Portable 1v  04/07/2015  CLINICAL DATA:  Generalized abdominal bloating and pain. EXAM: PORTABLE ABDOMEN - 1 VIEW COMPARISON:  None. FINDINGS: The bowel gas pattern is normal. No radio-opaque calculi or other significant radiographic abnormality are seen. IMPRESSION: No evidence of bowel obstruction or  ileus. Electronically Signed   By: Lupita Raider, M.D.   On: 04/07/2015 09:45   US Abdomen Limited Ruq  04/05/2015  CLINICAL DATA:  45 year old male with decompensated hepatic cirrhosis. EXAM: US ABDOMEN LIMITED - RIGHT UPPER QUADRANT COMPARISON:  Abdominal ultrasound 12/10/2014. FINDINGS: Comment: Image quality is suboptimal secondary to large patient body habitus. Gallbladder: No gallstones or wall thickening visualized. No sonographic Murphy sign noted by sonographer. Common bile duct: Diameter: 2.9 mm in the porta hepatis. Liver: Diffusely echogenic with diffusely heterogeneous hepatic echotexture. Nodular liver contour. No focal lesion identified. IMPRESSION: 1. No acute findings. Specifically, no evidence of gallstones and no findings to suggest acute cholecystitis at this time. 2. The appearance of the liver suggests underlying cirrhosis, likely with associated hepatic steatosis. Electronically Signed   By: Trudie Reed M.D.   On: 04/05/2015 20:42     CBC  Recent Labs Lab 04/05/15 1250 04/05/15 1841 04/06/15 0753 04/07/15 0550  WBC 11.7* 12.1* 7.5 6.9  HGB 13.8 13.8 12.1* 12.0*  HCT 42.1 40.6 36.8* 35.9*  PLT 107* 87* 81* 68*  MCV 102.4* 100.5* 101.7* 103.8*  MCH 33.6 34.2* 33.4 34.7*  MCHC 32.8 34.0 32.9 33.4  RDW 17.2* 17.2* 17.6* 17.7*  LYMPHSABS  --  2.6  --   --   MONOABS  --  1.0  --   --   EOSABS  --  0.1  --   --   BASOSABS  --  0.1  --   --     Chemistries   Recent Labs Lab 04/05/15 1250 04/06/15 0753 04/07/15 0550  NA 133* 138 136  K 3.4* 3.6 3.5  CL 95* 99* 99*  CO2 26 30 29   GLUCOSE 176* 105* 93  BUN <5* <5* <5*  CREATININE 0.70 0.67 0.64  CALCIUM 7.6* 7.7* 7.8*  AST 200* 151* 120*  ALT 57 43  39  ALKPHOS 285* 214* 205*  BILITOT 3.2* 5.0* 5.7*   ------------------------------------------------------------------------------------------------------------------ estimated creatinine clearance is 158 mL/min (by C-G formula based on Cr of  0.64). ------------------------------------------------------------------------------------------------------------------ No results for input(s): HGBA1C in the last 72 hours. ------------------------------------------------------------------------------------------------------------------ No results for input(s): CHOL, HDL, LDLCALC, TRIG, CHOLHDL, LDLDIRECT in the last 72 hours. ------------------------------------------------------------------------------------------------------------------  Recent Labs  04/07/15 0550  TSH 2.152   ------------------------------------------------------------------------------------------------------------------ No results for input(s): VITAMINB12, FOLATE, FERRITIN, TIBC, IRON, RETICCTPCT in the last 72 hours.  Coagulation profile  Recent Labs Lab 04/05/15 2047  INR 1.36    No results for input(s): DDIMER in the last 72 hours.  Cardiac Enzymes No results for input(s): CKMB, TROPONINI, MYOGLOBIN in the last 168 hours.  Invalid input(s): CK ------------------------------------------------------------------------------------------------------------------ Invalid input(s): POCBNP   Time Spent in minutes   35   SINGH,PRASHANT K M.D on 04/07/2015 at 10:27 AM  Between 7am to 7pm - Pager - [phone]  After 7pm go to [email or web address] - password [facility]  [facility] -  Office  [phone]

## 2015-04-07 NOTE — Progress Notes (Signed)
CRITICAL VALUE ALERT  Critical value received:  Blood culture: gram positive cocci in anaerobic bottle   Date of notification:  04/07/15  Time of notification:  0727  Critical value read back:Yes.    Nurse who received alert:  Hillery Aldoshanna stowe  MD notified (1st page):  Dr. Thedore MinsSingh  Time of first page:  0728  Responding MD: Dr. Thedore MinsSingh at the bedside at [id number]

## 2015-04-08 DIAGNOSIS — I441 Atrioventricular block, second degree: Secondary | ICD-10-CM | POA: Insufficient documentation

## 2015-04-08 LAB — CULTURE, BLOOD (ROUTINE X 2)

## 2015-04-08 LAB — COMPREHENSIVE METABOLIC PANEL
ALBUMIN: 2.2 g/dL — AB (ref 3.5–5.0)
ALK PHOS: 196 U/L — AB (ref 38–126)
ALT: 33 U/L (ref 17–63)
ANION GAP: 7 (ref 5–15)
AST: 105 U/L — ABNORMAL HIGH (ref 15–41)
BUN: <5 mg/dL — ABNORMAL LOW (ref 6–20)
CALCIUM: 7.7 mg/dL — AB (ref 8.9–10.3)
CHLORIDE: 102 mmol/L (ref 101–111)
CO2: 29 mmol/L (ref 22–32)
Creatinine, Ser: 0.63 mg/dL (ref 0.61–1.24)
GFR calc Af Amer: >60 mL/min (ref >60)
GFR calc non Af Amer: >60 mL/min (ref >60)
GLUCOSE: 123 mg/dL — AB (ref 65–99)
Potassium: 3.6 mmol/L (ref 3.5–5.1)
SODIUM: 138 mmol/L (ref 135–145)
Total Bilirubin: 5 mg/dL — ABNORMAL HIGH (ref 0.3–1.2)
Total Protein: 6.2 g/dL — ABNORMAL LOW (ref 6.5–8.1)

## 2015-04-08 LAB — CBC
HCT: 33.7 % — ABNORMAL LOW (ref 39.0–52.0)
HEMOGLOBIN: 11.6 g/dL — AB (ref 13.0–17.0)
MCH: 34.7 pg — ABNORMAL HIGH (ref 26.0–34.0)
MCHC: 34.4 g/dL (ref 30.0–36.0)
MCV: 100.9 fL — ABNORMAL HIGH (ref 78.0–100.0)
PLATELETS: 83 10*3/uL — AB (ref 150–400)
RBC: 3.34 MIL/uL — AB (ref 4.22–5.81)
RDW: 18 % — ABNORMAL HIGH (ref 11.5–15.5)
WBC: 7.5 10*3/uL (ref 4.0–10.5)

## 2015-04-08 LAB — VANCOMYCIN, TROUGH: Vancomycin Tr: 14 ug/mL (ref 10.0–20.0)

## 2015-04-08 MED ORDER — HYDROCODONE-ACETAMINOPHEN 5-325 MG PO TABS
1.0000 | ORAL_TABLET | ORAL | Status: DC | PRN
  Administered 2015-04-08 – 2015-04-09 (×5): 2 via ORAL
  Filled 2015-04-08 (×5): qty 2

## 2015-04-08 NOTE — Progress Notes (Signed)
Pharmacy Antibiotic Follow-up Note  Chad Marks is a 45 y.o. year-old male admitted on 04/05/2015.  The patient is currently on day day 3 of vancomycin for sepsis/cellulitis.  Assessment/Plan: Day #3 of vanc for sepsis/cellulitis. Afebrile, WBC wnl. SCr stable, normalized CrCl > 15900ml/min. VT came back today at 14 and drawn correctly.  Plan: Continue vancomycin 1,250mg  IV Q8 Monitor clinical picture, renal function, consider another VT if needed to monitor for accumulation F/U C&S, abx deescalation / LOT   Temp (24hrs), Avg:97.9 F (36.6 C), Min:97.4 F (36.3 C), Max:98.4 F (36.9 C)   Recent Labs Lab 04/05/15 1250 04/05/15 1841 04/06/15 0753 04/07/15 0550 04/08/15 0228  WBC 11.7* 12.1* 7.5 6.9 7.5    Recent Labs Lab 04/05/15 1250 04/06/15 0753 04/07/15 0550 04/08/15 0228  CREATININE 0.70 0.67 0.64 0.63   Estimated Creatinine Clearance: 158 mL/min (by C-G formula based on Cr of 0.63).    Allergies  Allergen Reactions  . Penicillins Rash    Told not to take med Has patient had a PCN reaction causing immediate rash, facial/tongue/throat swelling, SOB or lightheadedness with hypotension: Yes Has patient had a PCN reaction causing severe rash involving mucus membranes or skin necrosis: Yes. All over body. Has patient had a PCN reaction that required hospitalization: No Has patient had a PCN reaction occurring within the last 10 years: No If all of the above answers are "NO", then may proceed with Cephalosporin use.   Marland Kitchen. Amoxicillin Rash    Antimicrobials this admission: 12/31 Vanc>>  12/30 Aztreonam >> 1/2 12/30 Flagyl >> 12/31  Levels/dose changes this admission: 1/2 VT = 14  Microbiology results: 12/30 BCx 1/2 - CoNS 12/30 BCx x2 - NGx1D 12/30 UCx - mult. species present, suggest recollection 12/30 MRSA PCR neg  Thank you for allowing pharmacy to be a part of this patient's care.  Chad Marks, PharmD, BCPS Clinical Pharmacist Pager  [phone] 04/08/2015 6:26 PM

## 2015-04-08 NOTE — Progress Notes (Signed)
Patient Name: Chad Marks Date of Encounter: 04/08/2015  Principal Problem:   Sepsis (HCC) Active Problems:   Hypokalemia   Thrombocytopenia (HCC)   Cocaine abuse   Bilateral cellulitis of lower leg   Abdominal pain   Decompensated hepatic cirrhosis (HCC)   Alcohol abuse   Sepsis due to cellulitis (HCC)   Length of Stay: 3  SUBJECTIVE  The patient complains of no  Cp or sob Reviewed his plans for sobreity  [place] with a nondrinker, plans to go to AA  Has never followed through before  Is his cirrhosis all related to alcoohol???   CURRENT MEDS . aspirin  81 mg Oral Daily  . chlordiazePOXIDE  10 mg Oral TID  . folic acid  1 mg Oral Daily  . furosemide  40 mg Intravenous Daily  . heparin subcutaneous  5,000 Units Subcutaneous 3 times per day  . lactulose  10 g Oral TID  . multivitamin with minerals  1 tablet Oral Daily  . pantoprazole  40 mg Oral Daily  . spironolactone  50 mg Oral Daily  . thiamine  100 mg Oral Daily  . vancomycin  1,250 mg Intravenous Q8H    OBJECTIVE  Filed Vitals:   04/07/15 2314 04/08/15 0000 04/08/15 0400 04/08/15 0751  BP: 121/80  113/73 126/98  Pulse:  92 88 106  Temp: 98.4 F (36.9 C)  98.2 F (36.8 C) 97.9 F (36.6 C)  TempSrc: Oral  Oral Oral  Resp: 18   18  Height:      Weight:      SpO2:  95% 93% 94%    Intake/Output Summary (Last 24 hours) at 04/08/15 0927 Last data filed at 04/08/15 0155  Gross per 24 hour  Intake   1920 ml  Output   1675 ml  Net    245 ml   Filed Weights   04/05/15 1234 04/07/15 0238  Weight: 287 lb 3.2 oz (130.273 kg) 279 lb 1.6 oz (126.599 kg)    PHYSICAL EXAM  General: Pleasant, NAD. Neuro: Alert and oriented X 3. Moves all extremities spontaneously. Psych: Normal affect. HEENT:  Normal  Neck: Supple without bruits   Lungs:  Resp regular and unlabored, CTA. Heart: RRR no s3, s4, or murmurs. Abdomen: Soft, non-tender, non-distended, BS + x 4.    Extremities: No clubbing, cyanosis  edema.    Accessory Clinical Findings  CBC  Recent Labs  04/05/15 1841  04/07/15 0550 04/08/15 0228  WBC 12.1*  < > 6.9 7.5  NEUTROABS 8.4*  --   --   --   HGB 13.8  < > 12.0* 11.6*  HCT 40.6  < > 35.9* 33.7*  MCV 100.5*  < > 103.8* 100.9*  PLT 87*  < > 68* 83*  < > = values in this interval not displayed. Basic Metabolic Panel  Recent Labs  04/07/15 0550 04/08/15 0228  NA 136 138  K 3.5 3.6  CL 99* 102  CO2 29 29  GLUCOSE 93 123*  BUN <5* <5*  CREATININE 0.64 0.63  CALCIUM 7.8* 7.7*   Liver Function Tests  Recent Labs  04/07/15 0550 04/08/15 0228  AST 120* 105*  ALT 39 33  ALKPHOS 205* 196*  BILITOT 5.7* 5.0*  PROT 6.3* 6.2*  ALBUMIN 2.4* 2.2*    Recent Labs  04/05/15 1250  LIPASE 25    Recent Labs  04/07/15 0550  TSH 2.152    Radiology/Studies  [place] Abdomen Limited  04/06/2015  CLINICAL DATA:  Subsequent encounter for spontaneous bacterial peritonitis. EXAM: LIMITED ABDOMEN ULTRASOUND FOR ASCITES TECHNIQUE: Limited ultrasound survey for ascites was performed in all four abdominal quadrants. COMPARISON:  04/05/2015. FINDINGS: Focused 4 quadrant ultrasound to assess for intraperitoneal free fluid demonstrates only minimal intraperitoneal free fluid. IMPRESSION: Insufficient fluid for paracentesis. Electronically Signed   By: Kennith CenterEric  Mansell M.D.   On: 04/06/2015 10:26   Dg Abd Portable 1v  04/07/2015  CLINICAL DATA:  Generalized abdominal bloating and pain. EXAM: PORTABLE ABDOMEN - 1 VIEW COMPARISON:  None. FINDINGS: The bowel gas pattern is normal. No radio-opaque calculi or other significant radiographic abnormality are seen. IMPRESSION: No evidence of bowel obstruction or ileus. Electronically Signed   By: Lupita RaiderJames  Green Jr, M.D.   On: 04/07/2015 09:45   [place] Abdomen Limited Ruq  04/05/2015  CLINICAL DATA:  45 year old male with decompensated hepatic cirrhosis. EXAM: US ABDOMEN LIMITED - RIGHT UPPER QUADRANT  COMPARISON:  Abdominal ultrasound 12/10/2014. FINDINGS: Comment: Image quality is suboptimal secondary to large patient body habitus. Gallbladder: No gallstones or wall thickening visualized. No sonographic Murphy sign noted by sonographer. Common bile duct: Diameter: 2.9 mm in the porta hepatis. Liver: Diffusely echogenic with diffusely heterogeneous hepatic echotexture. Nodular liver contour. No focal lesion identified. IMPRESSION: 1. No acute findings. Specifically, no evidence of gallstones and no findings to suggest acute cholecystitis at this time. 2. The appearance of the liver suggests underlying cirrhosis, likely with associated hepatic steatosis. Electronically Signed   By: Trudie Reedaniel  Entrikin M.D.   On: 04/05/2015 20:42    TELE: SR to sinus tachycardia with multiple 2:1 and 3:1 blocks with max pause 2.2 seconds     ASSESSMENT AND PLAN  Problem List Cellulitis/Sepsis  Alcoholic Cirrhosis  Mobitz Type II Heart block Polysubstance abuse Thrombocytopenia CIWA protocol  Rhtyhm stable with intermittent rare heart block Think decision not to implant may be something we rethink in the abcence of IV drug use and efforts to remain sober Continue to monitor   Is his cirrhosis all related to alcoohol???   Signed, Sherryl MangesSteven Klein MD, [facility]ach[facility] ValleyFACC 04/08/2015

## 2015-04-08 NOTE — Progress Notes (Signed)
Pharmacy Antibiotic Follow-up Note  Chad Marks is a 45 y.o. year-old male admitted on 04/05/2015.  The patient is currently on day 3 of vancomycin for sepsis/cellulitis.  Assessment/Plan: - Aztreonam was discontinued today by Dr. Leroy SeaPrashant K Singh - Vancomycin will be continued without adjustments - Will check vancomycin trough today given q8h dosing without planned stop date  Temp (24hrs), Avg:98 F (36.7 C), Min:97.4 F (36.3 C), Max:98.4 F (36.9 C)   Recent Labs Lab 04/05/15 1250 04/05/15 1841 04/06/15 0753 04/07/15 0550 04/08/15 0228  WBC 11.7* 12.1* 7.5 6.9 7.5    Recent Labs Lab 04/05/15 1250 04/06/15 0753 04/07/15 0550 04/08/15 0228  CREATININE 0.70 0.67 0.64 0.63   Estimated Creatinine Clearance: 158 mL/min (by C-G formula based on Cr of 0.63).    Allergies  Allergen Reactions  . Penicillins Rash    Told not to take med Has patient had a PCN reaction causing immediate rash, facial/tongue/throat swelling, SOB or lightheadedness with hypotension: Yes Has patient had a PCN reaction causing severe rash involving mucus membranes or skin necrosis: Yes. All over body. Has patient had a PCN reaction that required hospitalization: No Has patient had a PCN reaction occurring within the last 10 years: No If all of the above answers are "NO", then may proceed with Cephalosporin use.   Marland Kitchen. Amoxicillin Rash    Antimicrobials this admission: 12/31 Vanc>>   12/30 Aztreonam >> 1/2 12/30 Flagyl >> 12/31  Levels/dose changes this admission: VT on 04/08/15 --> pending  Microbiology results: 12/30 BCx 1/2 - CoNS 12/30 BCx x2 - NGx1D 12/30 UCx - mult. species present, suggest recollection 12/30 MRSA PCR neg  Thank you for allowing pharmacy to be a part of this patient's care.  Arcola JanskyMeagan Senaya Dicenso, PharmD Clinical Pharmacy Resident Pager: [phone] 04/08/2015 10:11 AM

## 2015-04-08 NOTE — Progress Notes (Signed)
Patient Demographics:    Chad Marks, is a 45 y.o. male, DOB - 11-24-1970, ZOX:[id number]  Admit date - 04/05/2015   Admitting Physician Leroy Sea, MD  Outpatient Primary MD for the patient is No PCP Per Patient  LOS - 3   Chief Complaint  Patient presents with  . Abdominal Pain  . Leg Swelling        Subjective:    Chad Marks today has, No headache, No chest pain, No abdominal pain - No Nausea, No new weakness tingling or numbness, No Cough - SOB.  Some chronic bilateral leg pain.   Assessment  & Plan :     1. Sepsis from bilateral lower extremity cellulitis. Improved, Taper ABX, follow cultures, sepsis etiology seems to have resolved with stable blood pressure, patient does not appear toxic. Ultrasound of the abdomen was done not enough fluid to be tapped hence doubt he has SBP.  2. History of alcohol abuse, alcoholic liver cirrhosis. Continue Lasix and Aldactone, place on scheduled Librium, CIWA protocol. Seems to be going in early DTs. Counseled to quit alcohol. Outpatient GI follow-up.  3. GERD. On PPI.  4. History of cocaine abuse. Tonsil to continue abstaining, present urine drug screen negative for cocaine.  5. Thrombocytopenia. Due to alcoholic cirrhosis. Monitor no bleeding. On heparin will continue to monitor intermittently.  6. Second-degree heart block Mobitz type II. Cardiology on board, has been diagnosed previously, due to alcohol abuse cardiology not planning to place a pacemaker etc. He's symptom-free will be monitored. Avoid rate controlling agents.    Code Status : Full  Family Communication  : None present  Disposition Plan  :  Home once DTs resolved  Consults  :  Cardiology  Procedures  :   US Abdomen with Cirrhotic Liver, minimal to no  Ascites  DVT Prophylaxis  :    Heparin    Lab Results  Component Value Date   PLT 83* 04/08/2015    Inpatient Medications  Scheduled Meds: . aspirin  81 mg Oral Daily  . chlordiazePOXIDE  10 mg Oral TID  . folic acid  1 mg Oral Daily  . furosemide  40 mg Intravenous Daily  . heparin subcutaneous  5,000 Units Subcutaneous 3 times per day  . lactulose  10 g Oral TID  . multivitamin with minerals  1 tablet Oral Daily  . pantoprazole  40 mg Oral Daily  . spironolactone  50 mg Oral Daily  . thiamine  100 mg Oral Daily  . vancomycin  1,250 mg Intravenous Q8H   Continuous Infusions:  PRN Meds:.HYDROcodone-acetaminophen, LORazepam **OR** LORazepam, morphine injection, [DISCONTINUED] ondansetron **OR** ondansetron (ZOFRAN) IV  Antibiotics  :   Anti-infectives    Start     Dose/Rate Route Frequency Ordered Stop   04/06/15 1700  vancomycin (VANCOCIN) 1,250 mg in sodium chloride 0.9 % 250 mL IVPB     1,250 mg 166.7 mL/hr over 90 Minutes Intravenous Every 8 hours 04/05/15 1733     04/06/15 0845  vancomycin (VANCOCIN) 2,000 mg in sodium chloride 0.9 % 500 mL IVPB     2,000 mg 250 mL/hr over 120 Minutes Intravenous  Once 04/06/15 0841 04/06/15 1109   04/06/15 0200  aztreonam (AZACTAM) 2 g in dextrose  5 % 50 mL IVPB  Status:  Discontinued     2 g 100 mL/hr over 30 Minutes Intravenous 3 times per day 04/05/15 1733 04/08/15 0901   04/06/15 0200  metroNIDAZOLE (FLAGYL) IVPB 500 mg  Status:  Discontinued     500 mg 100 mL/hr over 60 Minutes Intravenous Every 8 hours 04/05/15 1733 04/06/15 1136   04/05/15 1715  aztreonam (AZACTAM) 2 g in dextrose 5 % 50 mL IVPB  Status:  Discontinued     2 g 100 mL/hr over 30 Minutes Intravenous  Once 04/05/15 1701 04/06/15 0159   04/05/15 1715  metroNIDAZOLE (FLAGYL) IVPB 500 mg  Status:  Discontinued     500 mg 100 mL/hr over 60 Minutes Intravenous  Once 04/05/15 1701 04/06/15 1136   04/05/15 1715  vancomycin (VANCOCIN) IVPB 1000 mg/200 mL premix   Status:  Discontinued     1,000 mg 200 mL/hr over 60 Minutes Intravenous  Once 04/05/15 1701 04/05/15 1712   04/05/15 1715  vancomycin (VANCOCIN) 2,000 mg in sodium chloride 0.9 % 500 mL IVPB  Status:  Discontinued     2,000 mg 250 mL/hr over 120 Minutes Intravenous  Once 04/05/15 1712 04/06/15 1107        Objective:   Filed Vitals:   04/07/15 2314 04/08/15 0000 04/08/15 0400 04/08/15 0751  BP: 121/80  113/73 126/98  Pulse:  92 88 106  Temp: 98.4 F (36.9 C)  98.2 F (36.8 C) 97.9 F (36.6 C)  TempSrc: Oral  Oral Oral  Resp: 18   18  Height:      Weight:      SpO2:  95% 93% 94%    Wt Readings from Last 3 Encounters:  04/07/15 126.599 kg (279 lb 1.6 oz)  12/08/14 111.131 kg (245 lb)  07/03/08 116.574 kg (257 lb)     Intake/Output Summary (Last 24 hours) at 04/08/15 0913 Last data filed at 04/08/15 0155  Gross per 24 hour  Intake   1920 ml  Output   1675 ml  Net    245 ml     Physical Exam  Awake Alert, Oriented X 3, No new F.N deficits, Normal affect, does have mild tremors and mild in early DTs [place].AT,PERRAL Supple Neck,No JVD, No cervical lymphadenopathy appriciated.  Symmetrical Chest wall movement, Good air movement bilaterally, CTAB RRR,No Gallops,Rubs or new Murmurs, No Parasternal Heave +ve B.Sounds, Abd Soft, No tenderness, No organomegaly appriciated, No rebound - guarding or rigidity. No Cyanosis,  2+ leg edema bipedal with warmth, multiple toenails were missing chronic fungal infection    Data Review:   Micro Results Recent Results (from the past 240 hour(s))  Urine culture     Status: None   Collection Time: 04/05/15 12:38 PM  Result Value Ref Range Status   Specimen Description URINE, CLEAN CATCH  Final   Special Requests NONE  Final   Culture MULTIPLE SPECIES PRESENT, SUGGEST RECOLLECTION  Final   Report Status 04/07/2015 FINAL  Final  Blood Culture (routine x 2)     Status: None   Collection Time: 04/05/15  6:30 PM  Result Value Ref  Range Status   Specimen Description BLOOD LEFT HAND  Final   Special Requests BOTTLES DRAWN AEROBIC AND ANAEROBIC 4CC  Final   Culture  Setup Time   Final    GRAM POSITIVE COCCI IN CLUSTERS ANAEROBIC BOTTLE ONLY CRITICAL RESULT CALLED TO, READ BACK BY AND VERIFIED WITH: S STOWE,RN AT 4782 04/07/15 BY L BENFIELD  Culture   Final    STAPHYLOCOCCUS SPECIES (COAGULASE NEGATIVE) THE SIGNIFICANCE OF ISOLATING THIS ORGANISM FROM A SINGLE SET OF BLOOD CULTURES WHEN MULTIPLE SETS ARE DRAWN IS UNCERTAIN. PLEASE NOTIFY THE MICROBIOLOGY DEPARTMENT WITHIN ONE WEEK IF SPECIATION AND SENSITIVITIES ARE REQUIRED.    Report Status 04/08/2015 FINAL  Final  Blood Culture (routine x 2)     Status: None (Preliminary result)   Collection Time: 04/05/15  6:38 PM  Result Value Ref Range Status   Specimen Description BLOOD RIGHT HAND  Final   Special Requests BOTTLES DRAWN AEROBIC ONLY 2CC  Final   Culture NO GROWTH 1 DAY  Final   Report Status PENDING  Incomplete  MRSA PCR Screening     Status: None   Collection Time: 04/07/15  2:53 AM  Result Value Ref Range Status   MRSA by PCR NEGATIVE NEGATIVE Final    Comment:        The GeneXpert MRSA Assay (FDA approved for NASAL specimens only), is one component of a comprehensive MRSA colonization surveillance program. It is not intended to diagnose MRSA infection nor to guide or monitor treatment for MRSA infections.     Radiology Reports US Abdomen Limited  04/06/2015  CLINICAL DATA:  Subsequent encounter for spontaneous bacterial peritonitis. EXAM: LIMITED ABDOMEN ULTRASOUND FOR ASCITES TECHNIQUE: Limited ultrasound survey for ascites was performed in all four abdominal quadrants. COMPARISON:  04/05/2015. FINDINGS: Focused 4 quadrant ultrasound to assess for intraperitoneal free fluid demonstrates only minimal intraperitoneal free fluid. IMPRESSION: Insufficient fluid for paracentesis. Electronically Signed   By: Kennith Center M.D.   On: 04/06/2015 10:26    Dg Abd Portable 1v  04/07/2015  CLINICAL DATA:  Generalized abdominal bloating and pain. EXAM: PORTABLE ABDOMEN - 1 VIEW COMPARISON:  None. FINDINGS: The bowel gas pattern is normal. No radio-opaque calculi or other significant radiographic abnormality are seen. IMPRESSION: No evidence of bowel obstruction or ileus. Electronically Signed   By: Lupita Raider, M.D.   On: 04/07/2015 09:45   US Abdomen Limited Ruq  04/05/2015  CLINICAL DATA:  45 year old male with decompensated hepatic cirrhosis. EXAM: US ABDOMEN LIMITED - RIGHT UPPER QUADRANT COMPARISON:  Abdominal ultrasound 12/10/2014. FINDINGS: Comment: Image quality is suboptimal secondary to large patient body habitus. Gallbladder: No gallstones or wall thickening visualized. No sonographic Murphy sign noted by sonographer. Common bile duct: Diameter: 2.9 mm in the porta hepatis. Liver: Diffusely echogenic with diffusely heterogeneous hepatic echotexture. Nodular liver contour. No focal lesion identified. IMPRESSION: 1. No acute findings. Specifically, no evidence of gallstones and no findings to suggest acute cholecystitis at this time. 2. The appearance of the liver suggests underlying cirrhosis, likely with associated hepatic steatosis. Electronically Signed   By: Trudie Reed M.D.   On: 04/05/2015 20:42     CBC  Recent Labs Lab 04/05/15 1250 04/05/15 1841 04/06/15 0753 04/07/15 0550 04/08/15 0228  WBC 11.7* 12.1* 7.5 6.9 7.5  HGB 13.8 13.8 12.1* 12.0* 11.6*  HCT 42.1 40.6 36.8* 35.9* 33.7*  PLT 107* 87* 81* 68* 83*  MCV 102.4* 100.5* 101.7* 103.8* 100.9*  MCH 33.6 34.2* 33.4 34.7* 34.7*  MCHC 32.8 34.0 32.9 33.4 34.4  RDW 17.2* 17.2* 17.6* 17.7* 18.0*  LYMPHSABS  --  2.6  --   --   --   MONOABS  --  1.0  --   --   --   EOSABS  --  0.1  --   --   --   BASOSABS  --  0.1  --   --   --  Chemistries   Recent Labs Lab 04/05/15 1250 04/06/15 0753 04/07/15 0550 04/08/15 0228  NA 133* 138 136 138  K 3.4* 3.6 3.5 3.6   CL 95* 99* 99* 102  CO2 26 30 29 29   GLUCOSE 176* 105* 93 123*  BUN <5* <5* <5* <5*  CREATININE 0.70 0.67 0.64 0.63  CALCIUM 7.6* 7.7* 7.8* 7.7*  AST 200* 151* 120* 105*  ALT 57 43 39 33  ALKPHOS 285* 214* 205* 196*  BILITOT 3.2* 5.0* 5.7* 5.0*   ------------------------------------------------------------------------------------------------------------------ estimated creatinine clearance is 158 mL/min (by C-G formula based on Cr of 0.63). ------------------------------------------------------------------------------------------------------------------ No results for input(s): HGBA1C in the last 72 hours. ------------------------------------------------------------------------------------------------------------------ No results for input(s): CHOL, HDL, LDLCALC, TRIG, CHOLHDL, LDLDIRECT in the last 72 hours. ------------------------------------------------------------------------------------------------------------------  Recent Labs  04/07/15 0550  TSH 2.152   ------------------------------------------------------------------------------------------------------------------ No results for input(s): VITAMINB12, FOLATE, FERRITIN, TIBC, IRON, RETICCTPCT in the last 72 hours.  Coagulation profile  Recent Labs Lab 04/05/15 2047  INR 1.36    No results for input(s): DDIMER in the last 72 hours.  Cardiac Enzymes No results for input(s): CKMB, TROPONINI, MYOGLOBIN in the last 168 hours.  Invalid input(s): CK ------------------------------------------------------------------------------------------------------------------ Invalid input(s): POCBNP   Time Spent in minutes   35   Cheyenne Bordeaux K M.D on 04/08/2015 at 9:13 AM  Between 7am to 7pm - Pager - [phone]  After 7pm go to [email or web address] - password [email or web address]  [facility] -  Office  [phone]

## 2015-04-08 NOTE — Care Management Note (Signed)
Case Management Note  Patient Details  Name: Chad Marks MRN: [id number] Date of Birth: 1971/01/12  Subjective/Objective:      Adm w sepsis              Action/Plan: lives w roommate per chart   Expected Discharge Date:                  Expected Discharge Plan:     In-House Referral:     Discharge planning Services     Post Acute Care Choice:    Choice offered to:     DME Arranged:    DME Agency:     HH Arranged:    HH Agency:     Status of Service:     Medicare Important Message Given:    Date Medicare IM Given:    Medicare IM give by:    Date Additional Medicare IM Given:    Additional Medicare Important Message give by:     If discussed at Long Length of Stay Meetings, dates discussed:    Additional Comments: ur review done  Hanley HaysDowell, Yitzchok Carriger T, RN 04/08/2015, 9:35 AM

## 2015-04-09 ENCOUNTER — Encounter: Payer: Self-pay | Admitting: Physician Assistant

## 2015-04-09 DIAGNOSIS — R188 Other ascites: Secondary | ICD-10-CM | POA: Insufficient documentation

## 2015-04-09 MED ORDER — LACTULOSE 10 GM/15ML PO SOLN: 10.0000 g | Freq: Every day | ORAL | Status: AC

## 2015-04-09 MED ORDER — HYDROCODONE-ACETAMINOPHEN 5-325 MG PO TABS: 1.0000 | ORAL_TABLET | Freq: Four times a day (QID) | ORAL | Status: AC | PRN

## 2015-04-09 MED ORDER — THIAMINE HCL 100 MG PO TABS: 100.0000 mg | ORAL_TABLET | Freq: Every day | ORAL | Status: AC

## 2015-04-09 MED ORDER — FOLIC ACID 1 MG PO TABS: 1.0000 mg | ORAL_TABLET | Freq: Every day | ORAL | Status: AC

## 2015-04-09 MED ORDER — SPIRONOLACTONE 25 MG PO TABS: 25.0000 mg | ORAL_TABLET | Freq: Every day | ORAL | Status: AC

## 2015-04-09 MED ORDER — PANTOPRAZOLE SODIUM 40 MG PO TBEC: 40.0000 mg | DELAYED_RELEASE_TABLET | Freq: Every day | ORAL | Status: AC

## 2015-04-09 MED ORDER — DOXYCYCLINE HYCLATE 100 MG PO CAPS: 100.0000 mg | ORAL_CAPSULE | Freq: Two times a day (BID) | ORAL | Status: AC

## 2015-04-09 MED ORDER — FUROSEMIDE 40 MG PO TABS: 40.0000 mg | ORAL_TABLET | Freq: Two times a day (BID) | ORAL | Status: AC

## 2015-04-09 MED FILL — PANTOPRAZOLE SOD DR 40 MG T: 40 | 30 days supply | Qty: 30 | Fill #0

## 2015-04-09 MED FILL — DOXYCYCLINE 100 MG TABLET: 100 | 5 days supply | Qty: 10 | Fill #0

## 2015-04-09 MED FILL — FOLIC ACID 1 MG TABLET: 1 | 30 days supply | Qty: 30 | Fill #0

## 2015-04-09 MED FILL — FUROSEMIDE 40 MG TABLET: 40 | 30 days supply | Qty: 60 | Fill #0

## 2015-04-09 MED FILL — LACTULOSE 10 GM/15 ML SOLN: 10 | 16 days supply | Qty: 240 | Fill #0

## 2015-04-09 MED FILL — SPIRONOLACTONE 25 MG TABLET: 25 | 30 days supply | Qty: 30 | Fill #0

## 2015-04-09 NOTE — Progress Notes (Signed)
PT Cancellation Note  Patient Details Name: Pascal LuxClifton Comes MRN: [id number] DOB: 10-25-1970   Cancelled Treatment:    Reason Eval/Treat Not Completed: PT screened, no needs identified, will sign off. Pt mobilizing independently without difficulty.   Maat Kafer 04/09/2015, 11:59 AM Skip Mayerary Kaylina Cahue PT [phone]

## 2015-04-09 NOTE — Discharge Instructions (Signed)
Follow with Primary MD  in 7 days  ° °Get CBC, CMP, 2 view Chest X ray checked  by Primary MD next visit.  ° ° °Activity: As tolerated with Full fall precautions use walker/cane & assistance as needed ° ° °Disposition Home   ° ° °Diet: Heart Healthy - Check your Weight same time everyday, if you gain over 2 pounds, or you develop in leg swelling, experience more shortness of breath or chest pain, call your Primary MD immediately. Follow Cardiac Low Salt Diet and 1.5 lit/day fluid restriction. ° ° °On your next visit with your primary care physician please Get Medicines reviewed and adjusted. ° ° °Please request your Prim.MD to go over all Hospital Tests and Procedure/Radiological results at the follow up, please get all Hospital records sent to your Prim MD by signing hospital release before you go home. ° ° °If you experience worsening of your admission symptoms, develop shortness of breath, life threatening emergency, suicidal or homicidal thoughts you must seek medical attention immediately by calling 911 or calling your MD immediately  if symptoms less severe. ° °You Must read complete instructions/literature along with all the possible adverse reactions/side effects for all the Medicines you take and that have been prescribed to you. Take any new Medicines after you have completely understood and accpet all the possible adverse reactions/side effects.  ° °Do not drive, operating heavy machinery, perform activities at heights, swimming or participation in water activities or provide baby sitting services if your were admitted for syncope or siezures until you have seen by Primary MD or a Neurologist and advised to do so again. ° °Do not drive when taking Pain medications.  ° ° °Do not take more than prescribed Pain, Sleep and Anxiety Medications ° °Special Instructions: If you have smoked or chewed Tobacco  in the last 2 yrs please stop smoking, stop any regular Alcohol  and or any Recreational drug use. ° °Wear  Seat belts while driving. ° ° °Please note ° °You were cared for by a hospitalist during your hospital stay. If you have any questions about your discharge medications or the care you received while you were in the hospital after you are discharged, you can call the unit and asked to speak with the hospitalist on call if the hospitalist that took care of you is not available. Once you are discharged, your primary care physician will handle any further medical issues. Please note that NO REFILLS for any discharge medications will be authorized once you are discharged, as it is imperative that you return to your primary care physician (or establish a relationship with a primary care physician if you do not have one) for your aftercare needs so that they can reassess your need for medications and monitor your lab values. ° °

## 2015-04-09 NOTE — Progress Notes (Signed)
SUBJECTIVE: The patient is doing well today.  Hoping to go home soon.  Denies any symptoms of AV block.  At this time, he denies chest pain, shortness of breath, or any new concerns.  Marland Kitchen. aspirin  81 mg Oral Daily  . chlordiazePOXIDE  10 mg Oral TID  . folic acid  1 mg Oral Daily  . furosemide  40 mg Intravenous Daily  . heparin subcutaneous  5,000 Units Subcutaneous 3 times per day  . lactulose  10 g Oral TID  . multivitamin with minerals  1 tablet Oral Daily  . pantoprazole  40 mg Oral Daily  . spironolactone  50 mg Oral Daily  . thiamine  100 mg Oral Daily  . vancomycin  1,250 mg Intravenous Q8H      OBJECTIVE: Physical Exam: Filed Vitals:   04/08/15 1128 04/08/15 1641 04/08/15 2041 04/09/15 0700  BP: 136/84 106/72 110/63 105/47  Pulse:   89 88  Temp: 97.7 F (36.5 C) 98 F (36.7 C) 98.4 F (36.9 C) 98.4 F (36.9 C)  TempSrc: Oral Oral Oral Oral  Resp: 18 18 18 18   Height:   5\' 11"  (1.803 m)   Weight:   219 lb 9.6 oz (99.61 kg)   SpO2: 96% 98% 98% 97%    Intake/Output Summary (Last 24 hours) at 04/09/15 0956 Last data filed at 04/09/15 0501  Gross per 24 hour  Intake    990 ml  Output   1500 ml  Net   -510 ml    Telemetry reveals sinus rhythm, no prolonged Av block overnigth  GEN- The patient is chronically ill appearing, alert and oriented x 3 today.   Head- normocephalic, atraumatic Eyes-  Sclera clear, conjunctiva pink Ears- hearing intact Oropharynx- clear Neck- supple,   Lungs- Clear to ausculation bilaterally, normal work of breathing Heart- Regular rate and rhythm  GI- soft, NT, ND, + BS Extremities- no clubbing, cyanosis, +2edema Skin- no rash or lesion Psych- euthymic mood, full affect Neuro- no gross deficits appreciated  LABS: Basic Metabolic Panel:  Recent Labs  40/98/1099/04/22 0550 04/08/15 0228  NA 136 138  K 3.5 3.6  CL 99* 102  CO2 29 29  GLUCOSE 93 123*  BUN <5* <5*  CREATININE 0.64 0.63  CALCIUM 7.8* 7.7*   Liver Function  Tests:  Recent Labs  04/07/15 0550 04/08/15 0228  AST 120* 105*  ALT 39 33  ALKPHOS 205* 196*  BILITOT 5.7* 5.0*  PROT 6.3* 6.2*  ALBUMIN 2.4* 2.2*   No results for input(s): LIPASE, AMYLASE in the last 72 hours. CBC:  Recent Labs  04/07/15 0550 04/08/15 0228  WBC 6.9 7.5  HGB 12.0* 11.6*  HCT 35.9* 33.7*  MCV 103.8* 100.9*  PLT 68* 83*   Thyroid Function Tests:  Recent Labs  04/07/15 0550  TSH 2.152      ASSESSMENT AND PLAN:  Principal Problem:   Sepsis (HCC) Active Problems:   Hypokalemia   Thrombocytopenia (HCC)   Cocaine abuse   Bilateral cellulitis of lower leg   Abdominal pain   Decompensated hepatic cirrhosis (HCC)   Alcohol abuse   Sepsis due to cellulitis (HCC)   Mobitz type 2 second degree heart block  1. Mobitz II     Historically noted to be nocturnal and completely asymptomatic, not felt device candidate secondary to decompensated alcoholic cirrhosis of liver, GERD, polysubstance abuse including cocaine, heroin and marijuana with ongoing alcohol abuse      BP appears stable  Now presents with sepsis which further reduces our ability to consider pacing.    Would avoid pacing at this time.  No driving.  If he has syncope, will reconsider.  2. Sepsis/cellulitis     Afebrile     Per primary team  3. Poly-substance abuse     IM concerned yesterday about early DT's     tox positive only for opiates this admit, in September ETOH was 89 and + for cocaine  4. Thrombocytopenia/cirrhosis  5. Acute on chronic diastolic dysfunction Continue gentle diuresis  No further inpatient EP workup planned at this time.  Electrophysiology team to see as needed while here. Please call with questions.  I will schedule outpatient follow-up with Dr Kem Kays MD, [facility] 04/09/2015 10:34 AM

## 2015-04-09 NOTE — Discharge Summary (Signed)
Chad Marks, is a 45 y.o. male  DOB 11/12/1970  MRN [id number].  Admission date:  04/05/2015  Admitting Physician  Thurnell Lose, MD  Discharge Date:  04/09/2015   Primary MD  No PCP Per Patient  Recommendations for primary care physician for things to follow:   Monitor weight, edema, diuretic dose and BMP closely.  Outpatient follow-up with cardiology along with GI.   Admission Diagnosis  Ascites [R18.8] Sepsis due to cellulitis (HCC) [L03.90, A41.9] Decompensated hepatic cirrhosis (HCC) [K72.90]   Discharge Diagnosis  Ascites [R18.8] Sepsis due to cellulitis (HCC) [L03.90, A41.9] Decompensated hepatic cirrhosis ([place]) [K72.90]    Principal Problem:   Sepsis ([place]) Active Problems:   Hypokalemia   Thrombocytopenia (HCC)   Cocaine abuse   Bilateral cellulitis of lower leg   Abdominal pain   Decompensated hepatic cirrhosis (HCC)   Alcohol abuse   Sepsis due to cellulitis ([place])   Mobitz type 2 second degree heart block   Ascites      Past Medical History  Diagnosis Date  . Cirrhosis ([place])   . Alcohol abuse   . GERD (gastroesophageal reflux disease)   . Polysubstance abuse     alcohol, cocaine, heroin, marijuana    Past Surgical History  Procedure Laterality Date  . Rotator cuff repair         HPI  from the history and physical done on the day of admission:   45 year old male with decompensated alcoholic cirrhosis of liver, GERD, polysubstance abuse including cocaine, heroin and marijuana with ongoing alcohol abuse presented to the ED with almost 1 week of bilateral lower extremity redness and swelling associated with abdominal distention and some pain. Patient reports that he still drinks about 2-3 beers a day. He noticed increased swelling of his legs with redness and some weeping. He  has increased abdominal distention with some diffuse pain. Denies subjective fever, chills, nausea, vomiting, hematemesis or melena. Denies any chest pain, palpitations, shortness of breath, bowel or urinary symptoms. Denies any confusion. He reports poor appetite. He lives with a roommate and has been unable to walk around for the past few days. Reports he does not take any medications. Reports that he was previously referred to hospice but since he improved he was discharged home.  Course in the ED Patient was afebrile but was tachycardic in the 120s. Respiratory rate, blood pressure and O2 sats were normal. Blood work showed WBC of 11.7, hemoglobin of 13.8 and platelets of 107. Chemistry shows sodium of 133, K of 3.4, chloride 95, unifying to 0.7. Glucose was 176. Lactic acid was 2.48 . LFTs were abnormal. Patient met criteria for early sepsis. Patient given a dose of IV vancomycin, aztreonam and Flagyl for bilateral lower leg cellulitis and concern for abdominal pain. Hospitalist admission requested.      Hospital Course:     1. Sepsis from bilateral lower extremity cellulitis. Improved, as treated with IV antibiotics, so far all cultures negative, sepsis aetiology seems to have resolved with stable blood pressure, patient  does not appear toxic. Ultrasound of the abdomen was done not enough fluid to be tapped hence doubt he has SBP. We'll taper him to oral doxycycline for 5 more days and to do diuresis to reduce edema. Outpatient follow-up with PCP.  2. History of alcohol abuse, alcoholic liver cirrhosis. Continue Lasix and Aldactone, he was placed on scheduled Librium, CIWA protocol. Went into mild DTs but now completely resolved. Counseled to quit alcohol. Outpatient GI follow-up for cirrhosis, placed on diuretics on discharge does have peripheral edema as well with minimal ascites.  3. GERD. On PPI.  4. History of cocaine abuse. Tonsil to continue abstaining, present urine drug screen  negative for cocaine.  5. Thrombocytopenia. Due to alcoholic cirrhosis. Monitor no bleeding. On heparin will continue to monitor intermittently.  6. Second-degree heart block Mobitz type II. Cardiology on board, has been diagnosed previously, due to alcohol abuse cardiology not planning to place a pacemaker etc. He's symptom-free from his heart standpoint. Avoid rate controlling agents. Will follow with cardiology outpatient if he continues to abstain from drug abuse and alcohol pacemaker could be reconsidered.       Discharge Condition: Fair  Follow UP  Follow-up Information    Follow up with [facility] On 04/10/2015.   Specialty:  Internal Medicine   Why:  Appoinment scheduled to establish Primary Care at the [facility] Wednesday 1/4 at 1015 am with [facility] NP    Contact information:   [place] [phone]      Follow up with Silvano Rusk, MD. Schedule an appointment as soon as possible for a visit in 1 week.   Specialty:  Gastroenterology   Why:  Alcoholic cirrhosis   Contact information:   [address] [place] [address] [phone]       Follow up with Virl Axe, MD. Schedule an appointment as soon as possible for a visit in 1 week.   Specialty:  Cardiology   Why:  Heart block   Contact information:   [address] [place] [address] [phone]        [place] and Activity recommendation: See Discharge Instructions below  Discharge Instructions       Discharge Instructions    Body fluid culture with gram stain    Complete by:  As directed      Discharge instructions    Complete by:  As directed   Follow with Primary MD  in 7 days   Get CBC, CMP, 2 view Chest X ray checked  by Primary MD next visit.    Activity: As tolerated with Full fall precautions use walker/cane & assistance as needed   Disposition Home       Diet:   Heart Healthy  Check your Weight same time everyday, if you gain over 2 pounds, or you develop in leg swelling, experience more shortness of breath or chest pain, call your Primary MD immediately. Follow Cardiac Low Salt Diet and 1.5 lit/day fluid restriction.   On your next visit with your primary care physician please Get Medicines reviewed and adjusted.   Please request your Prim.MD to go over all Hospital Tests and Procedure/Radiological results at the follow up, please get all Hospital records sent to your Prim MD by signing hospital release before you go home.   If you experience worsening of your admission symptoms, develop shortness of breath, life  threatening emergency, suicidal or homicidal thoughts you must seek medical attention immediately by calling 911 or calling your MD immediately  if symptoms less severe.  You Must read complete instructions/literature along with all the possible adverse reactions/side effects for all the Medicines you take and that have been prescribed to you. Take any new Medicines after you have completely understood and accpet all the possible adverse reactions/side effects.   Do not drive, operating heavy machinery, perform activities at heights, swimming or participation in water activities or provide baby sitting services if your were admitted for syncope or siezures until you have seen by Primary MD or a Neurologist and advised to do so again.  Do not drive when taking Pain medications.    Do not take more than prescribed Pain, Sleep and Anxiety Medications  Special Instructions: If you have smoked or chewed Tobacco  in the last 2 yrs please stop smoking, stop any regular Alcohol  and or any Recreational drug use.  Wear Seat belts while driving.   Please note  You were cared for by a hospitalist during your hospital stay. If you have any questions about your discharge medications or the care you received while you were in the  hospital after you are discharged, you can call the unit and asked to speak with the hospitalist on call if the hospitalist that took care of you is not available. Once you are discharged, your primary care physician will handle any further medical issues. Please note that NO REFILLS for any discharge medications will be authorized once you are discharged, as it is imperative that you return to your primary care physician (or establish a relationship with a primary care physician if you do not have one) for your aftercare needs so that they can reassess your need for medications and monitor your lab values.     Increase activity slowly    Complete by:  As directed              Discharge Medications       Medication List    STOP taking these medications        esomeprazole 40 MG capsule  Commonly known as:  Price these medications        doxycycline 100 MG capsule  Commonly known as:  VIBRAMYCIN  Take 1 capsule (100 mg total) by mouth 2 (two) times daily.     folic acid 1 MG tablet  Commonly known as:  FOLVITE  Take 1 tablet (1 mg total) by mouth daily.     furosemide 40 MG tablet  Commonly known as:  LASIX  Take 1 tablet (40 mg total) by mouth 2 (two) times daily.     HYDROcodone-acetaminophen 5-325 MG tablet  Commonly known as:  NORCO/VICODIN  Take 1 tablet by mouth every 6 (six) hours as needed for moderate pain.     lactulose 10 GM/15ML solution  Commonly known as:  CHRONULAC  Take 15 mLs (10 g total) by mouth daily.     pantoprazole 40 MG tablet  Commonly known as:  PROTONIX  Take 1 tablet (40 mg total) by mouth daily.     spironolactone 25 MG tablet  Commonly known as:  ALDACTONE  Take 1 tablet (25 mg total) by mouth daily.     thiamine 100 MG tablet  Take 1 tablet (100 mg total) by mouth daily.        Major procedures and Radiology Reports - PLEASE  review detailed and final reports for all details, in brief -     US Abdomen with Cirrhotic  Liver, minimal to no Ascites  US Abdomen Limited  04/06/2015  CLINICAL DATA:  Subsequent encounter for spontaneous bacterial peritonitis. EXAM: LIMITED ABDOMEN ULTRASOUND FOR ASCITES TECHNIQUE: Limited ultrasound survey for ascites was performed in all four abdominal quadrants. COMPARISON:  04/05/2015. FINDINGS: Focused 4 quadrant ultrasound to assess for intraperitoneal free fluid demonstrates only minimal intraperitoneal free fluid. IMPRESSION: Insufficient fluid for paracentesis. Electronically Signed   By: Misty Stanley M.D.   On: 04/06/2015 10:26   Dg Abd Portable 1v  04/07/2015  CLINICAL DATA:  Generalized abdominal bloating and pain. EXAM: PORTABLE ABDOMEN - 1 VIEW COMPARISON:  None. FINDINGS: The bowel gas pattern is normal. No radio-opaque calculi or other significant radiographic abnormality are seen. IMPRESSION: No evidence of bowel obstruction or ileus. Electronically Signed   By: Marijo Conception, M.D.   On: 04/07/2015 09:45   US Abdomen Limited Ruq  04/05/2015  CLINICAL DATA:  45 year old male with decompensated hepatic cirrhosis. EXAM: US ABDOMEN LIMITED - RIGHT UPPER QUADRANT COMPARISON:  Abdominal ultrasound 12/10/2014. FINDINGS: Comment: Image quality is suboptimal secondary to large patient body habitus. Gallbladder: No gallstones or wall thickening visualized. No sonographic Murphy sign noted by sonographer. Common bile duct: Diameter: 2.9 mm in the porta hepatis. Liver: Diffusely echogenic with diffusely heterogeneous hepatic echotexture. Nodular liver contour. No focal lesion identified. IMPRESSION: 1. No acute findings. Specifically, no evidence of gallstones and no findings to suggest acute cholecystitis at this time. 2. The appearance of the liver suggests underlying cirrhosis, likely with associated hepatic steatosis. Electronically Signed   By: Vinnie Langton M.D.   On: 04/05/2015 20:42    Micro Results      Recent Results (from the past 240 hour(s))  Urine culture      Status: None   Collection Time: 04/05/15 12:38 PM  Result Value Ref Range Status   Specimen Description URINE, CLEAN CATCH  Final   Special Requests NONE  Final   Culture MULTIPLE SPECIES PRESENT, SUGGEST RECOLLECTION  Final   Report Status 04/07/2015 FINAL  Final  Blood Culture (routine x 2)     Status: None   Collection Time: 04/05/15  6:30 PM  Result Value Ref Range Status   Specimen Description BLOOD LEFT HAND  Final   Special Requests BOTTLES DRAWN AEROBIC AND ANAEROBIC 4CC  Final   Culture  Setup Time   Final    GRAM POSITIVE COCCI IN CLUSTERS ANAEROBIC BOTTLE ONLY CRITICAL RESULT CALLED TO, READ BACK BY AND VERIFIED WITH: S STOWE,RN AT 8592 04/07/15 BY L BENFIELD    Culture   Final    STAPHYLOCOCCUS SPECIES (COAGULASE NEGATIVE) THE SIGNIFICANCE OF ISOLATING THIS ORGANISM FROM A SINGLE SET OF BLOOD CULTURES WHEN MULTIPLE SETS ARE DRAWN IS UNCERTAIN. PLEASE NOTIFY THE MICROBIOLOGY DEPARTMENT WITHIN ONE WEEK IF SPECIATION AND SENSITIVITIES ARE REQUIRED.    Report Status 04/08/2015 FINAL  Final  Blood Culture (routine x 2)     Status: None (Preliminary result)   Collection Time: 04/05/15  6:38 PM  Result Value Ref Range Status   Specimen Description BLOOD RIGHT HAND  Final   Special Requests BOTTLES DRAWN AEROBIC ONLY 2CC  Final   Culture NO GROWTH 2 DAYS  Final   Report Status PENDING  Incomplete  MRSA PCR Screening     Status: None   Collection Time: 04/07/15  2:53 AM  Result Value Ref Range Status  MRSA by PCR NEGATIVE NEGATIVE Final    Comment:        The GeneXpert MRSA Assay (FDA approved for NASAL specimens only), is one component of a comprehensive MRSA colonization surveillance program. It is not intended to diagnose MRSA infection nor to guide or monitor treatment for MRSA infections.        Today   Subjective    Chad Marks today has no headache,no chest abdominal pain,no new weakness tingling or numbness, feels much better wants to go home today.      Objective   Blood pressure 105/47, pulse 92, temperature 98.4 F (36.9 C), temperature source Oral, resp. rate 18, height _0  (1.803 m), weight 99.61 kg (219 lb 9.6 oz), SpO2 97 %.   Intake/Output Summary (Last 24 hours) at 04/09/15 1032 Last data filed at 04/09/15 1015  Gross per 24 hour  Intake    990 ml  Output   2350 ml  Net  -1360 ml    Exam Awake Alert, Oriented x 3, No new F.N deficits, Normal affect [place].AT,PERRAL Supple Neck,No JVD, No cervical lymphadenopathy appriciated.  Symmetrical Chest wall movement, Good air movement bilaterally, CTAB RRR,No Gallops,Rubs or new Murmurs, No Parasternal Heave +ve B.Sounds, Abd Soft, Non tender, No organomegaly appriciated, No rebound -guarding or rigidity. No Cyanosis, Clubbing, 1+ edema, No new Rash or bruise, improved cellulitis   Data Review   CBC w Diff: Lab Results  Component Value Date   WBC 7.5 04/08/2015   HGB 11.6* 04/08/2015   HCT 33.7* 04/08/2015   PLT 83* 04/08/2015   LYMPHOPCT 21 04/05/2015   MONOPCT 8 04/05/2015   EOSPCT 1 04/05/2015   BASOPCT 1 04/05/2015    CMP: Lab Results  Component Value Date   NA 138 04/08/2015   K 3.6 04/08/2015   CL 102 04/08/2015   CO2 29 04/08/2015   BUN <5* 04/08/2015   CREATININE 0.63 04/08/2015   PROT 6.2* 04/08/2015   ALBUMIN 2.2* 04/08/2015   BILITOT 5.0* 04/08/2015   ALKPHOS 196* 04/08/2015   AST 105* 04/08/2015   ALT 33 04/08/2015  .   Total Time in preparing paper work, data evaluation and todays exam - 35 minutes  Thurnell Lose M.D on 04/09/2015 at 10:32 AM  [facility]   Office  [phone]

## 2015-04-09 NOTE — Progress Notes (Signed)
DC instructions and prescriptions given-pt verbalized understanding. IV dc'd tele box removed and returned to front desk. Pt ambulated with NA to lobby at 1330 all belongings out of room.

## 2015-04-09 NOTE — Care Management Note (Signed)
Case Management Note  Patient Details  Name: Chad Marks MRN: [id number] Date of Birth: 04/03/1971  Subjective/Objective:                 Spoke with patient at the bedside. He asked for his medicaid number because he lost his card, CM provided that to him. He denied any difficulties with obtaining medication or needing DME. He states he is on disability and takes the bus for transport. Patient states he has been ambulating well and declines physical therapy   Action/Plan:  Dc to home today, self care.  Expected Discharge Date:                  Expected Discharge Plan:  Home/Self Care  In-House Referral:     Discharge planning Services  CM Consult  Post Acute Care Choice:    Choice offered to:     DME Arranged:    DME Agency:     HH Arranged:    HH Agency:     Status of Service:  Completed, signed off  Medicare Important Message Given:    Date Medicare IM Given:    Medicare IM give by:    Date Additional Medicare IM Given:    Additional Medicare Important Message give by:     If discussed at Long Length of Stay Meetings, dates discussed:    Additional Comments:  Lawerance SabalDebbie Aliviana Burdell, RN 04/09/2015, 11:59 AM

## 2015-04-10 ENCOUNTER — Ambulatory Visit: Payer: Medicaid Other | Admitting: Family Medicine

## 2015-04-11 LAB — CULTURE, BLOOD (ROUTINE X 2): Culture: NO GROWTH

## 2015-04-15 ENCOUNTER — Encounter: Payer: Medicaid Other | Admitting: Physician Assistant

## 2015-04-16 ENCOUNTER — Encounter: Payer: Medicaid Other | Admitting: Physician Assistant

## 2015-04-23 NOTE — Progress Notes (Deleted)
Electrophysiology Office Note   Date:  04/23/2015   ID:  Chad Marks, DOB 16-Aug-1970, MRN [id number]  PCP:  No PCP Per Patient Primary Electrophysiologist: *** Dickie Cloe Jorja Loa, MD    No chief complaint on file.    History of Present Illness: Chad Marks is a 45 y.o. male who presents today for electrophysiology evaluation.   Chad Marks is a 45 yo man with alcohol abuse, polysubstance abuse, alcoholic cirrhosis, GERD.  He presented to the ER in December with leg pain and swelling as well as abdominal distention.  While in the hospital, he was found to have Mobitz II AV block.  He was seen in the hospital and was told that pacemaker could be considered if he could prove that he was sober and not using IV drugs.  At that time, he was found to be in sepsis due to bilateral lower extremity cellulitis, EtOH withdrawal.  He was also admitted to the hospital in September with alcohol withdrawal, nausea, vomiting, and elevated LFTs and as found to have sinus pauses.  He was asymptomatic at that time and a pacemaker as not placed.   Today, he denies*** symptoms of palpitations, chest pain, shortness of breath, orthopnea, PND, lower extremity edema, claudication, dizziness, presyncope, syncope, bleeding, or neurologic sequela. The patient is tolerating medications without difficulties and is otherwise without complaint today.    Past Medical History  Diagnosis Date  . Cirrhosis (HCC)   . Alcohol abuse   . GERD (gastroesophageal reflux disease)   . Polysubstance abuse     alcohol, cocaine, heroin, marijuana   Past Surgical History  Procedure Laterality Date  . Rotator cuff repair       Current Outpatient Prescriptions  Medication Sig Dispense Refill  . doxycycline (VIBRAMYCIN) 100 MG capsule Take 1 capsule (100 mg total) by mouth 2 (two) times daily. 10 capsule 0  . folic acid (FOLVITE) 1 MG tablet Take 1 tablet (1 mg total) by mouth daily. 30 tablet 0  . furosemide (LASIX) 40 MG  tablet Take 1 tablet (40 mg total) by mouth 2 (two) times daily. 60 tablet 0  . HYDROcodone-acetaminophen (NORCO/VICODIN) 5-325 MG tablet Take 1 tablet by mouth every 6 (six) hours as needed for moderate pain. 15 tablet 0  . lactulose (CHRONULAC) 10 GM/15ML solution Take 15 mLs (10 g total) by mouth daily. 240 mL 0  . pantoprazole (PROTONIX) 40 MG tablet Take 1 tablet (40 mg total) by mouth daily. 30 tablet 0  . spironolactone (ALDACTONE) 25 MG tablet Take 1 tablet (25 mg total) by mouth daily. 30 tablet 0  . thiamine 100 MG tablet Take 1 tablet (100 mg total) by mouth daily. 30 tablet 0   No current facility-administered medications for this visit.    Allergies:   Penicillins and Amoxicillin   Social History:  The patient  reports that he has been smoking Cigarettes.  He has never used smokeless tobacco. He reports that he drinks alcohol. He reports that he uses illicit drugs (IV, Cocaine, Marijuana, Methamphetamines, and Heroin).   Family History:  The patient's ***family history includes Cancer in his father.    ROS:  Please see the history of present illness.   Otherwise, review of systems is positive for ***.   All other systems are reviewed and negative.    PHYSICAL EXAM: VS:  There were no vitals taken for this visit. , BMI There is no weight on file to calculate BMI. GEN: Well nourished, well developed, in  no acute distress HEENT: normal Neck: no JVD, carotid bruits, or masses Cardiac: ***RRR; no murmurs, rubs, or gallops,no edema  Respiratory:  clear to auscultation bilaterally, normal work of breathing GI: soft, nontender, nondistended, + BS MS: no deformity or atrophy Skin: warm and dry, *** device pocket is well healed Neuro:  Strength and sensation are intact Psych: euthymic mood, full affect  EKG:  EKG {ACTION; IS/IS ZOX:[id number]} ordered today. The ekg ordered today shows ***  *** Device interrogation is reviewed today in detail.  See PaceArt for  details.   Recent Labs: 12/11/2014: Magnesium 1.9 04/07/2015: TSH 2.152 04/08/2015: ALT 33; BUN <5*; Creatinine, Ser 0.63; Hemoglobin 11.6*; Platelets 83*; Potassium 3.6; Sodium 138    Lipid Panel  No results found for: CHOL, TRIG, HDL, CHOLHDL, VLDL, LDLCALC, LDLDIRECT   Wt Readings from Last 3 Encounters:  04/08/15 219 lb 9.6 oz (99.61 kg)  12/08/14 245 lb (111.131 kg)  07/03/08 257 lb (116.574 kg)      Other studies Reviewed: Additional studies/ records that were reviewed today include: ***  Review of the above records today demonstrates: ***   ASSESSMENT AND PLAN:  1.  ***    Current medicines are reviewed at length with the patient today.   The patient {ACTIONS; HAS/DOES NOT HAVE:19233} concerns regarding his medicines.  The following changes were made today:  {NONE DEFAULTED:18576}  Labs/ tests ordered today include: *** No orders of the defined types were placed in this encounter.     Disposition:   FU with *** {gen number [id number]} {Days to years:10300}  Signed, Kodah Maret Jorja Loa, MD  04/23/2015 3:04 PM     [facility] [address] [place] [address] [phone] (office) [phone] (fax)  This encounter was created in error - please disregard.

## 2015-04-24 ENCOUNTER — Encounter: Payer: Medicaid Other | Admitting: Cardiology

## 2015-04-25 ENCOUNTER — Encounter: Payer: Self-pay | Admitting: Cardiology

## 2015-04-26 NOTE — Progress Notes (Signed)
Encounter opened in error.  Loman [facility], MD

## 2015-05-02 ENCOUNTER — Ambulatory Visit: Payer: Medicaid Other | Admitting: Family Medicine

## 2015-05-03 ENCOUNTER — Ambulatory Visit: Payer: Medicaid Other | Admitting: Physician Assistant

## 2015-05-08 DEATH — deceased

## 2015-05-16 ENCOUNTER — Encounter: Payer: Self-pay | Admitting: Cardiology

## 2016-08-19 IMAGING — US US ABDOMEN LIMITED
1 series · 13 of 25 positions shown · non-contrast
Comparison: [REDACTED] CT Abdomen and Pelvis
07/27/2008

CLINICAL DATA: 43-year-old male with abdominal pain. Medical
history indicates Cirrhosis. Initial encounter.

EXAM:
US ABDOMEN LIMITED - RIGHT UPPER QUADRANT

[Series 1: us abdomen limited · 0.31mm/px · 13 of 39 slices shown]
[im 1/39]
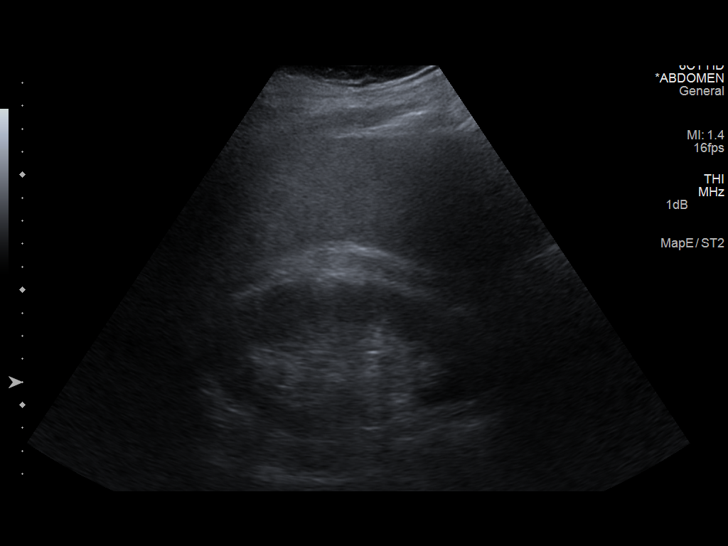
[im 4/39]
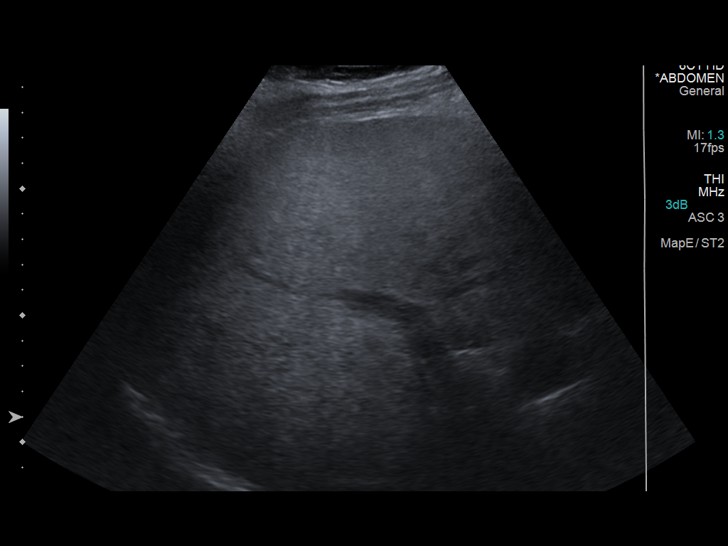
[im 7/39]
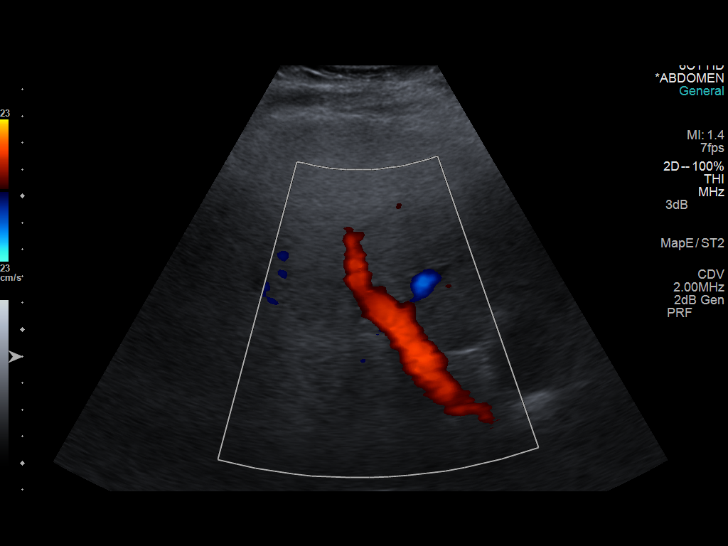
[im 10/39]
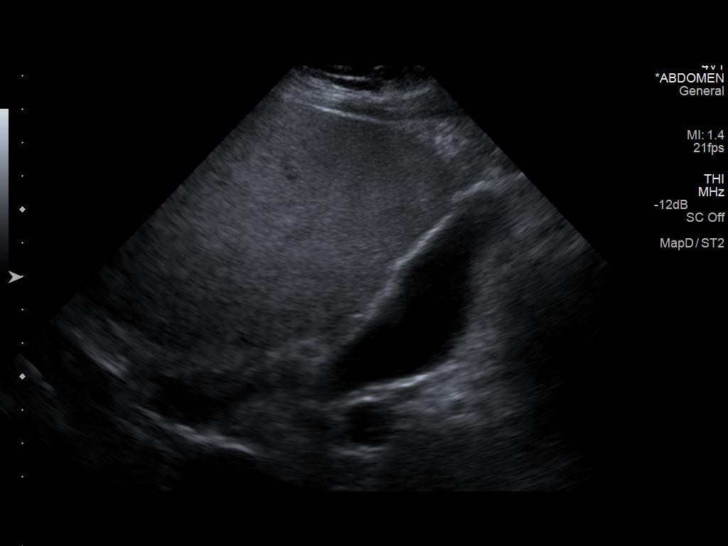
[im 13/39]
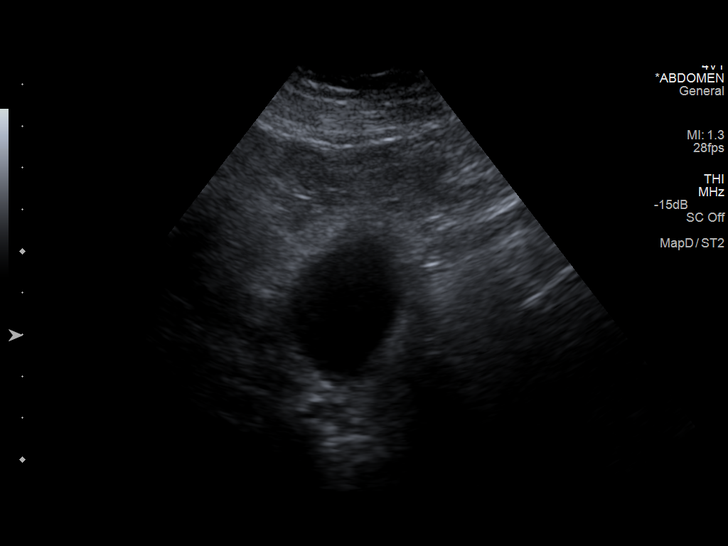
[im 16/39]
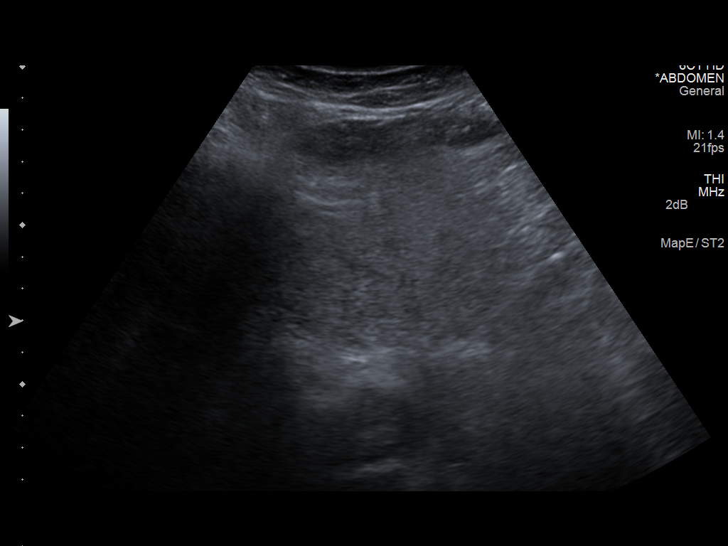
[im 20/39]
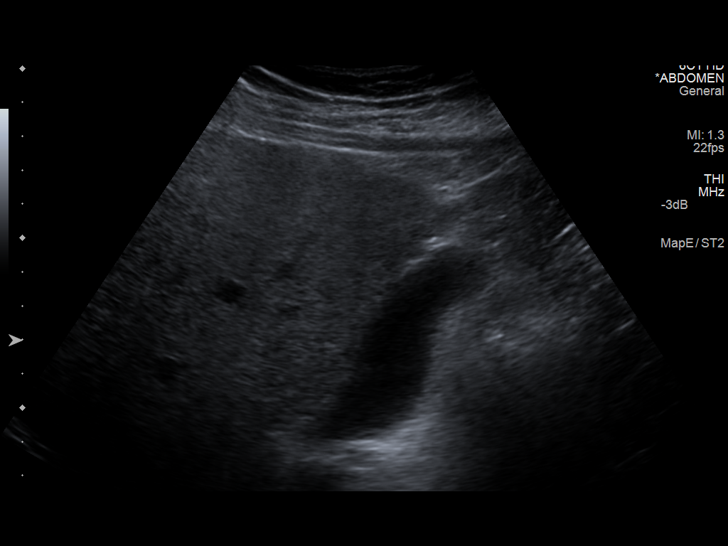
[im 23/39]
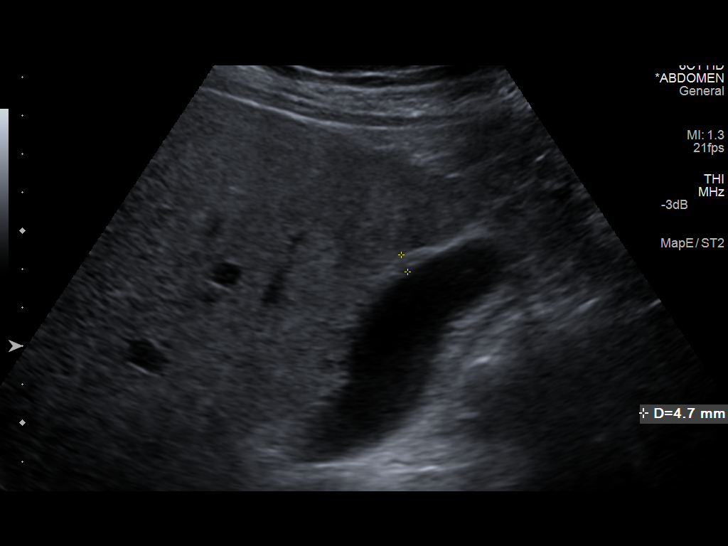
[im 26/39]
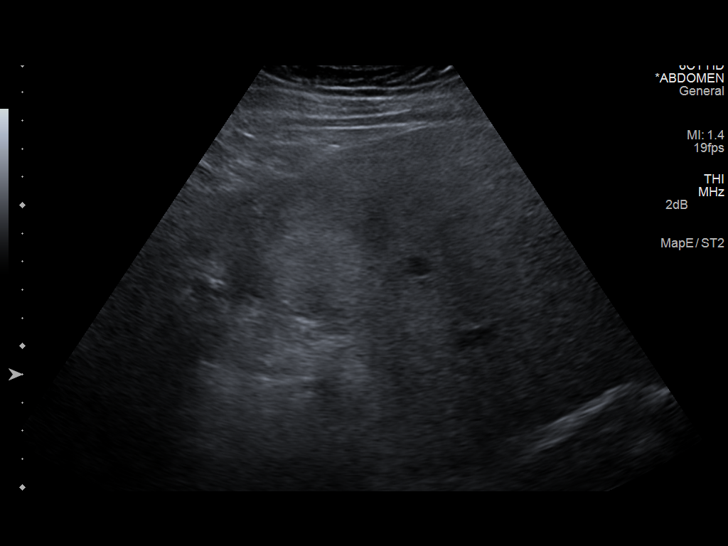
[im 29/39]
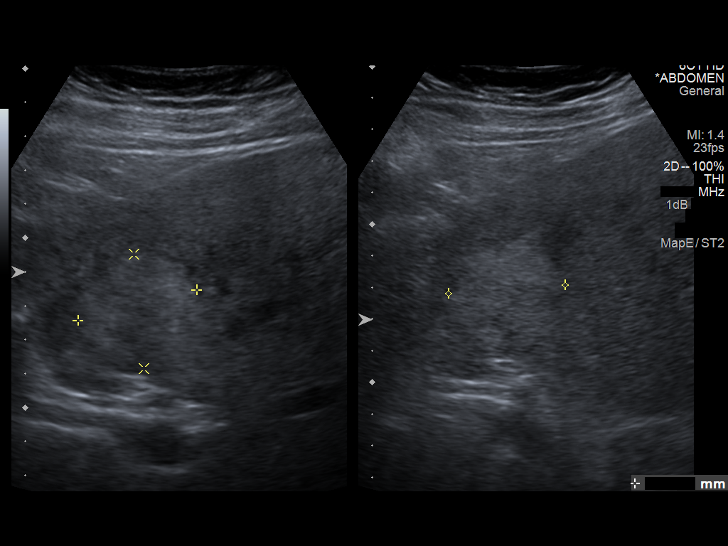
[im 32/39]
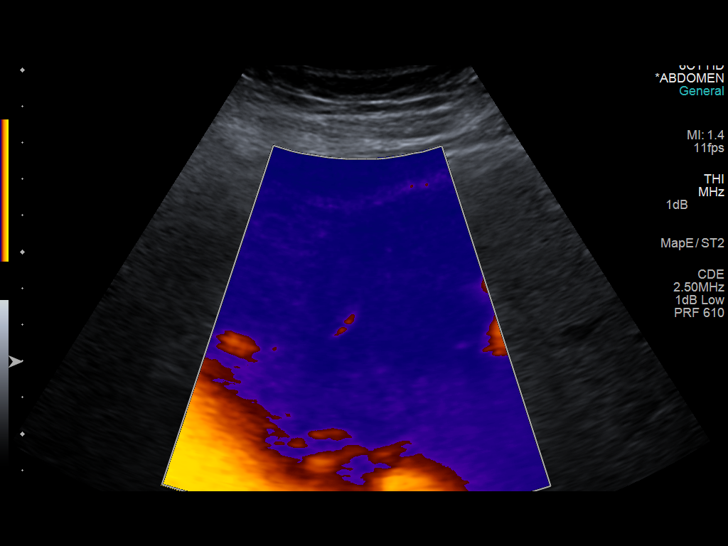
[im 35/39]
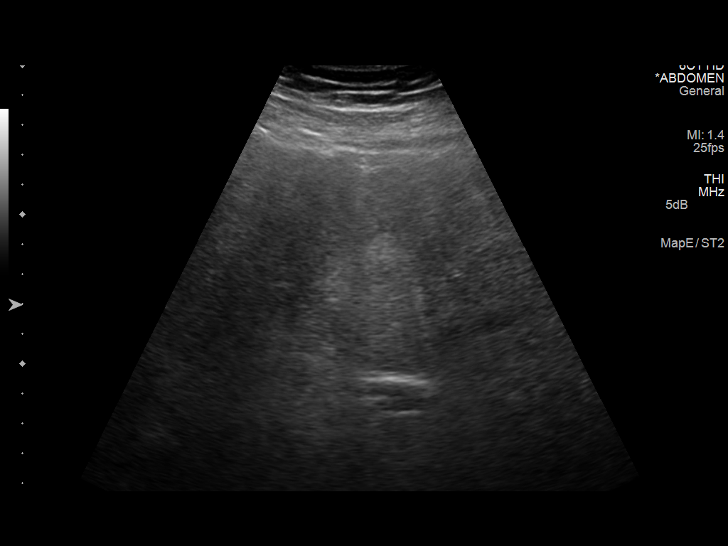
[im 39/39]
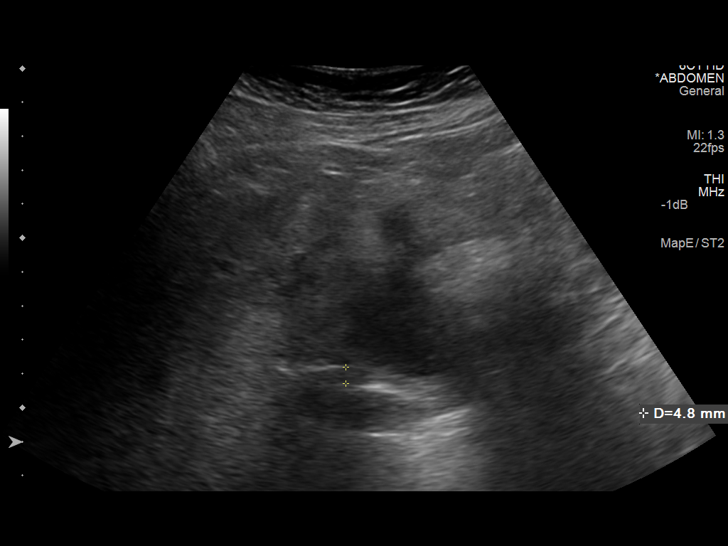

[13 of 25 positions shown; findings below may reference images not displayed]

FINDINGS: Gallbladder:

Mild wall thickening up to 5 mm. No sludge or echogenic stones. No
sonographic Murphy sign elicited. No pericholecystic fluid.

Common bile duct:

Diameter: Suboptimally visualized.  4-5 mm, normal.

Liver:

Echogenic liver. No intrahepatic biliary ductal dilatation. Right
lobe round and mildly lobulated echogenic area measuring 3.6 x 3.4 x
3.7 cm (image 32). There does appear to be some internal vascularity
on power Doppler (image 35. This lesion was not evident on the
noncontrast 0191 study. No other discrete liver lesion.

Other findings: Negative visible right kidney
IMPRESSION: 1. Hepatic steatosis. Nonspecific 3.7 cm discrete liver lesion in
the right lobe. CT Abdomen and Pelvis with IV contrast may
characterize further. Failing that, an outpatient liver MRI without
and with contrast would be recommended.
2. Diffuse gallbladder wall thickening, favor secondary to chronic
liver disease in this setting. No gallstones or biliary obstruction.

## 2016-12-13 IMAGING — US US ABDOMEN LIMITED
1 series · 14 of 25 positions shown · non-contrast
Comparison: Abdominal ultrasound 12/10/2014.

CLINICAL DATA: 44-year-old male with decompensated hepatic
cirrhosis.

EXAM:
US ABDOMEN LIMITED - RIGHT UPPER QUADRANT

[Series 1: us abdomen limited · 0.25mm/px · 14 of 53 slices shown]
[im 1/53]
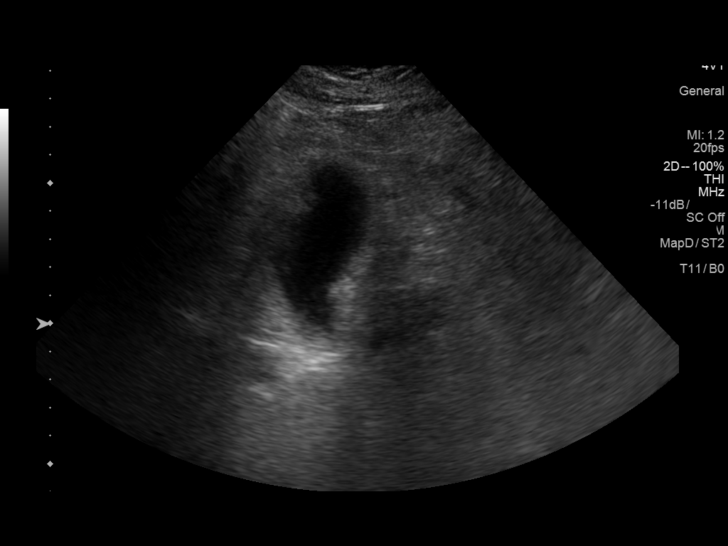
[im 5/53]
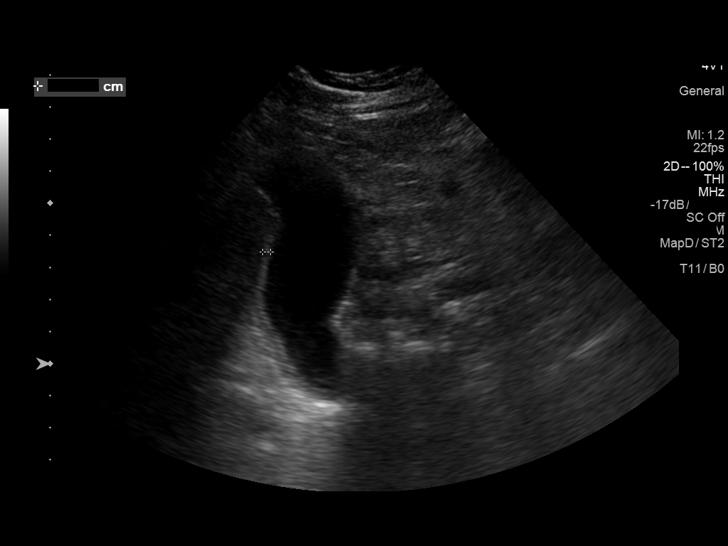
[im 9/53]
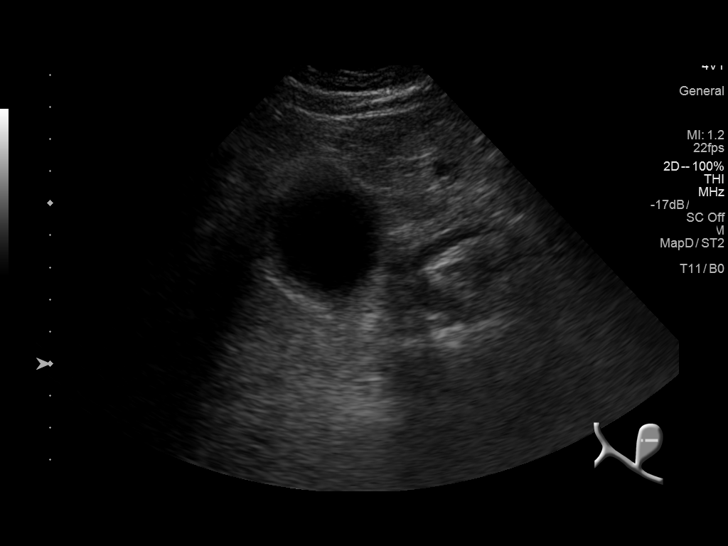
[im 14/53]
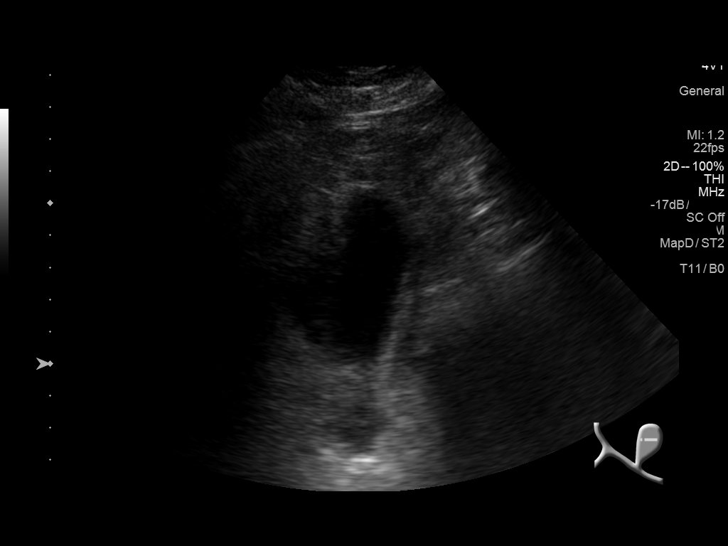
[im 18/53]
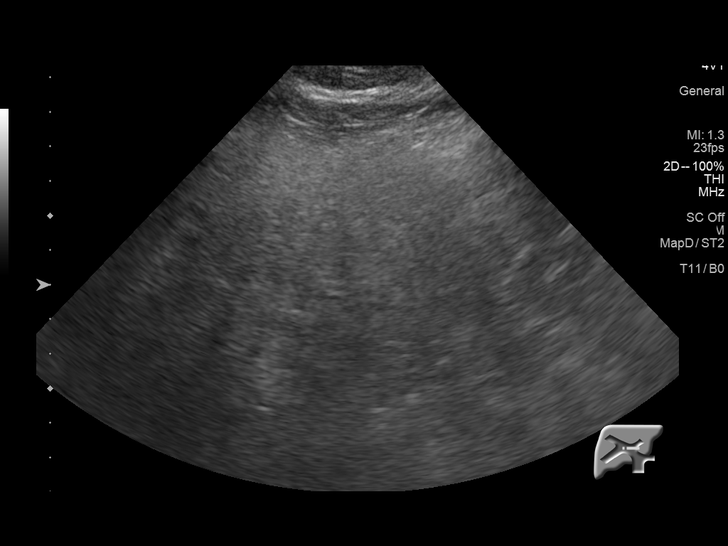
[im 20/53]
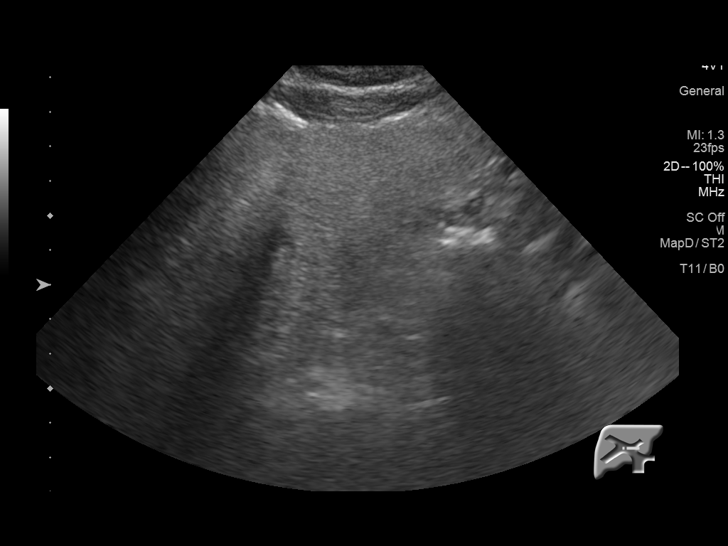
[im 24/53]
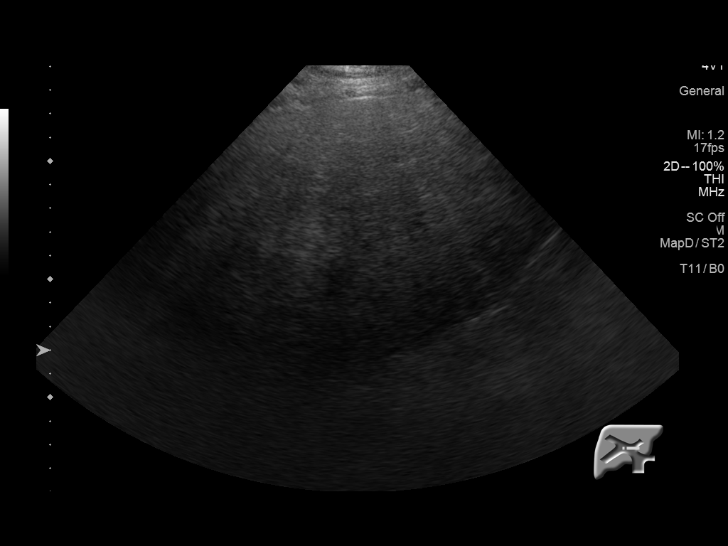
[im 29/53]
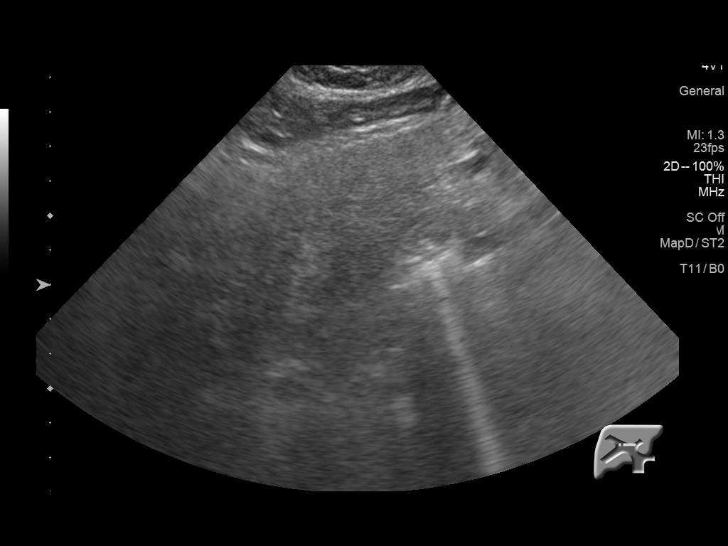
[im 33/53]
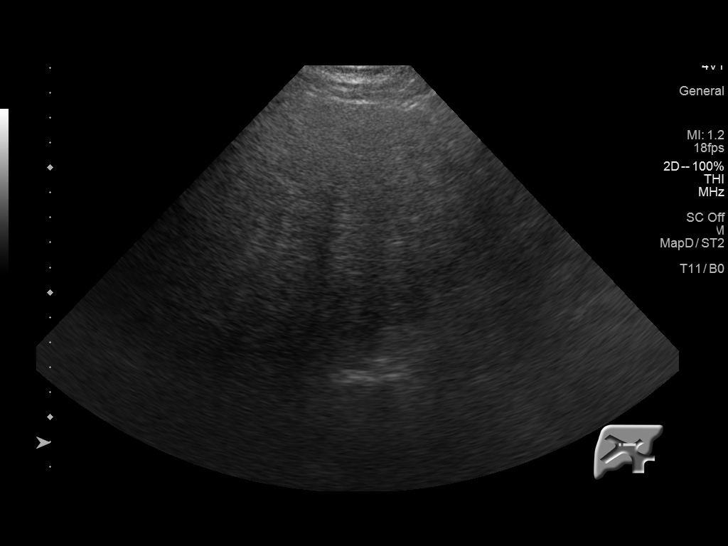
[im 35/53]
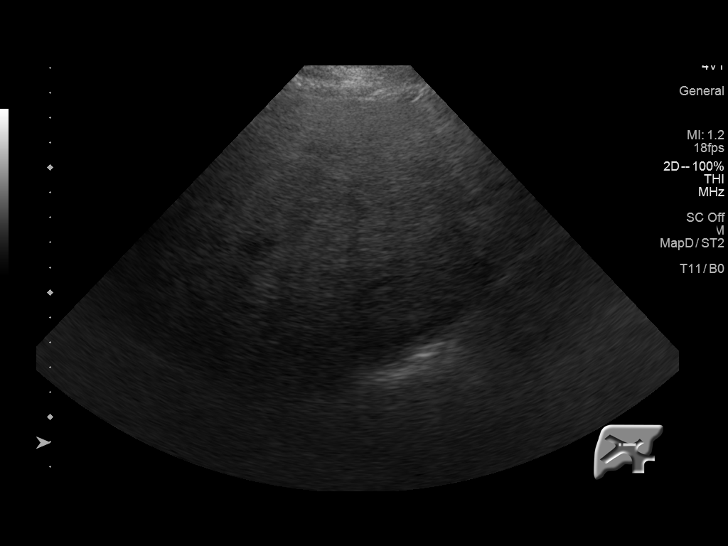
[im 40/53]
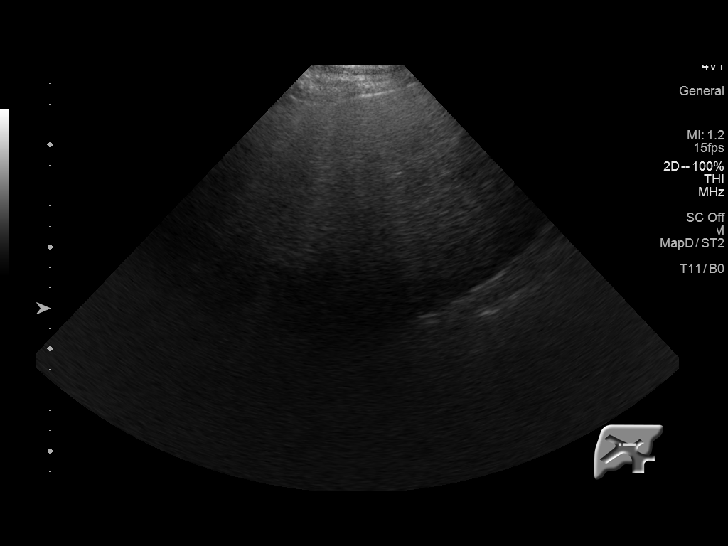
[im 44/53]
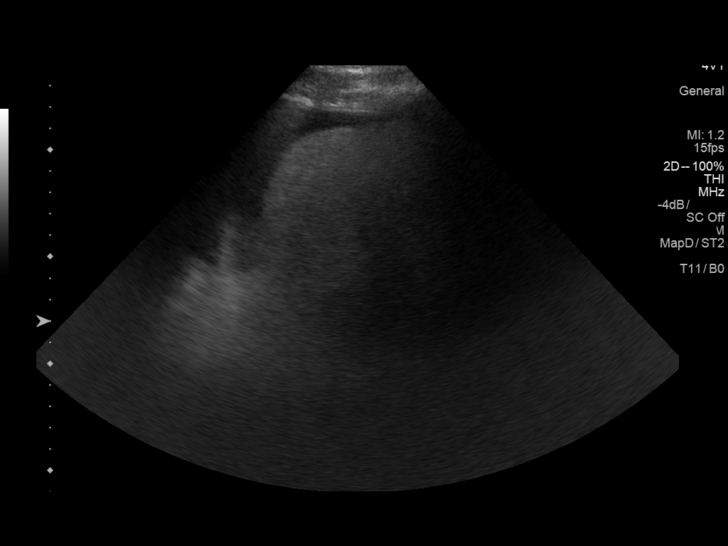
[im 48/53]
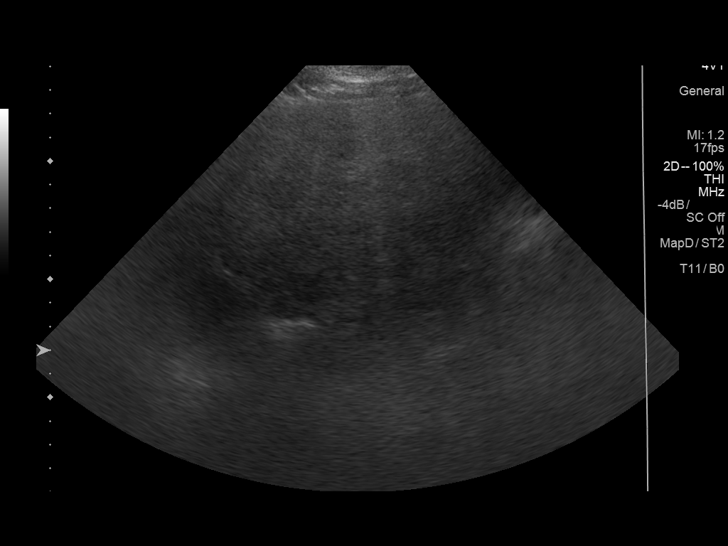
[im 53/53]
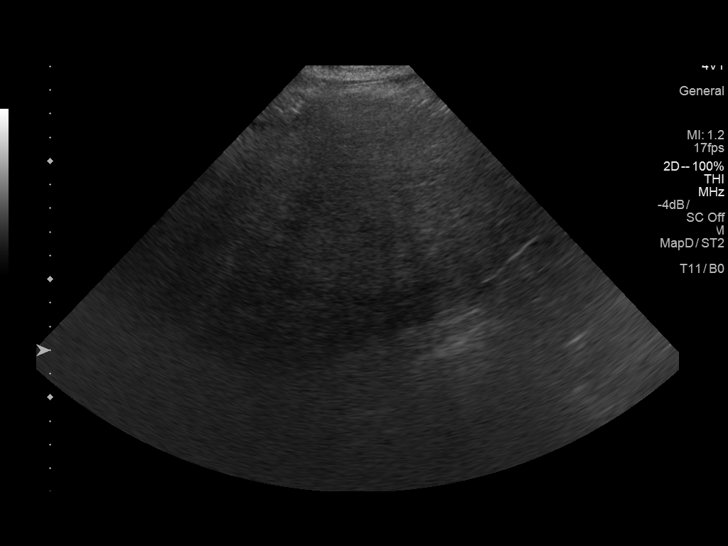

[14 of 25 positions shown; findings below may reference images not displayed]

FINDINGS: Comment: Image quality is suboptimal secondary to large patient body
habitus.

Gallbladder:

No gallstones or wall thickening visualized. No sonographic Murphy
sign noted by sonographer.

Common bile duct:

Diameter: 2.9 mm in the porta hepatis.

Liver:

Diffusely echogenic with diffusely heterogeneous hepatic
echotexture. Nodular liver contour. No focal lesion identified.
IMPRESSION: 1. No acute findings. Specifically, no evidence of gallstones and no
findings to suggest acute cholecystitis at this time.
2. The appearance of the liver suggests underlying cirrhosis, likely
with associated hepatic steatosis.
